# Patient Record
Sex: Female | Born: 1970 | Race: White | Hispanic: No | Marital: Single | Smoking: Current every day smoker
Health system: Southern US, Community
[De-identification: ages and names within clinical notes are randomized; demographics above are authoritative.]

## PROBLEM LIST (undated history)

## (undated) DIAGNOSIS — K2270 Barrett's esophagus without dysplasia: Secondary | ICD-10-CM

## (undated) HISTORY — PX: HERNIA REPAIR: SHX51

---

## 2004-12-08 ENCOUNTER — Emergency Department: Payer: Self-pay | Admitting: Emergency Medicine

## 2005-10-15 ENCOUNTER — Emergency Department: Payer: Self-pay | Admitting: Emergency Medicine

## 2006-02-11 ENCOUNTER — Emergency Department: Payer: Self-pay | Admitting: Internal Medicine

## 2007-09-17 ENCOUNTER — Emergency Department: Payer: Self-pay | Admitting: Emergency Medicine

## 2007-10-09 ENCOUNTER — Emergency Department: Payer: Self-pay | Admitting: Emergency Medicine

## 2007-10-22 ENCOUNTER — Emergency Department: Payer: Self-pay | Admitting: Emergency Medicine

## 2007-11-03 ENCOUNTER — Emergency Department: Payer: Self-pay | Admitting: Emergency Medicine

## 2007-11-13 ENCOUNTER — Other Ambulatory Visit: Payer: Self-pay

## 2007-11-13 ENCOUNTER — Emergency Department: Payer: Self-pay | Admitting: Emergency Medicine

## 2007-12-07 ENCOUNTER — Emergency Department (HOSPITAL_COMMUNITY): Admission: EM | Admit: 2007-12-07 | Discharge: 2007-12-07 | Payer: Self-pay | Admitting: Emergency Medicine

## 2007-12-09 ENCOUNTER — Emergency Department: Payer: Self-pay | Admitting: Emergency Medicine

## 2007-12-16 ENCOUNTER — Emergency Department: Payer: Self-pay | Admitting: Emergency Medicine

## 2007-12-23 ENCOUNTER — Emergency Department: Payer: Self-pay | Admitting: Emergency Medicine

## 2008-01-07 ENCOUNTER — Emergency Department: Payer: Self-pay | Admitting: Emergency Medicine

## 2008-01-19 ENCOUNTER — Emergency Department: Payer: Self-pay | Admitting: Emergency Medicine

## 2008-01-30 ENCOUNTER — Emergency Department: Payer: Self-pay | Admitting: Emergency Medicine

## 2008-06-14 ENCOUNTER — Emergency Department: Payer: Self-pay | Admitting: Emergency Medicine

## 2008-07-18 ENCOUNTER — Emergency Department: Payer: Self-pay | Admitting: Emergency Medicine

## 2008-07-18 ENCOUNTER — Other Ambulatory Visit: Payer: Self-pay

## 2008-10-09 IMAGING — US ABDOMEN ULTRASOUND
1 series · 17 of 25 positions shown · non-contrast
Comparison: none

REASON FOR EXAM: RUQ pain, transaminitis
COMMENTS:

[Series 1: abdomen ultrasound · 17 of 61 slices shown]
[im 1/61]
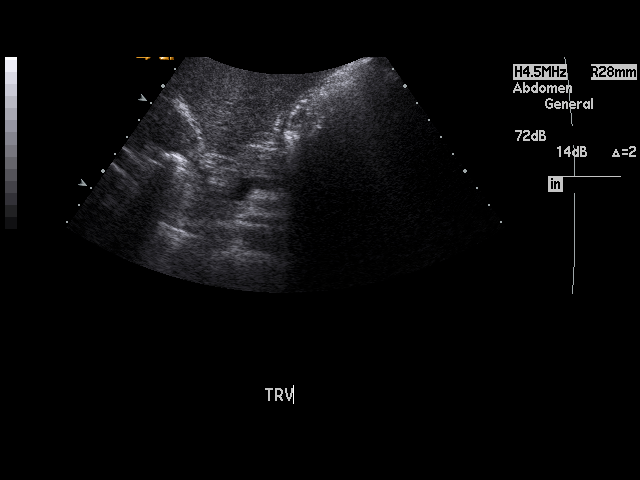
[im 6/61]
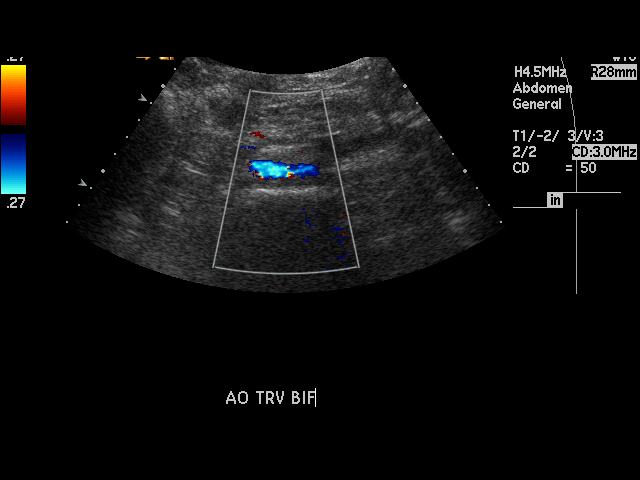
[im 8/61]
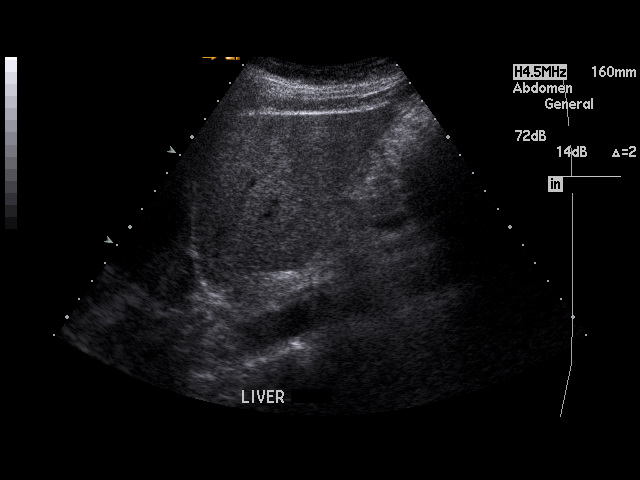
[im 13/61]
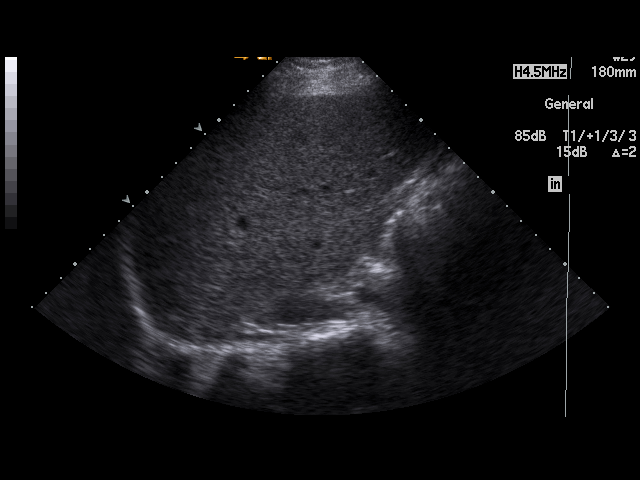
[im 16/61]
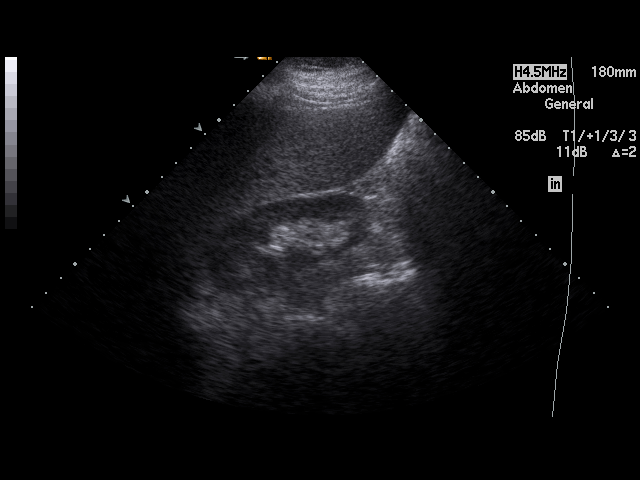
[im 21/61]
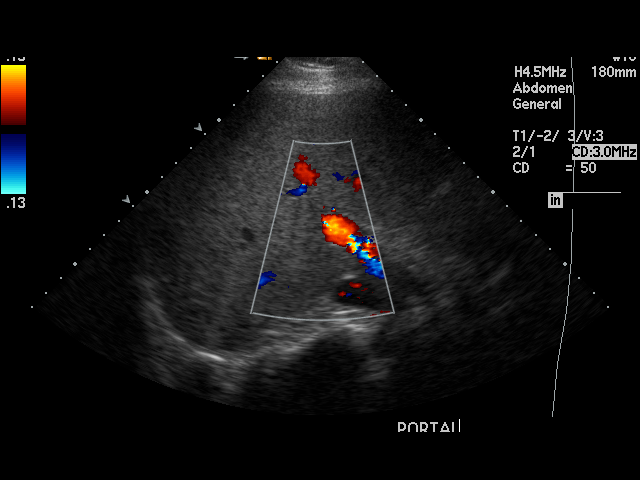
[im 23/61]
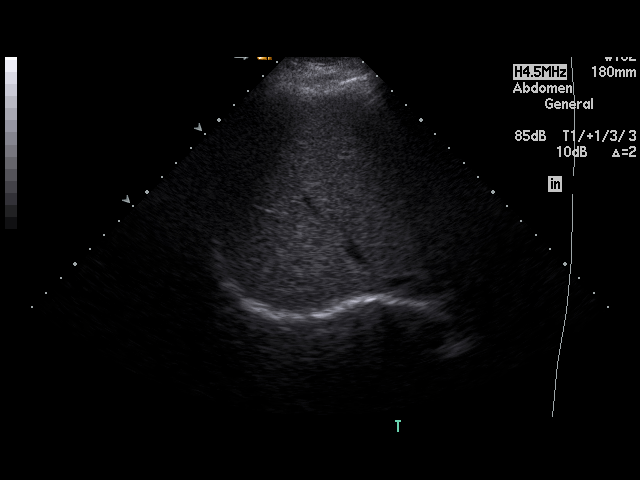
[im 28/61]
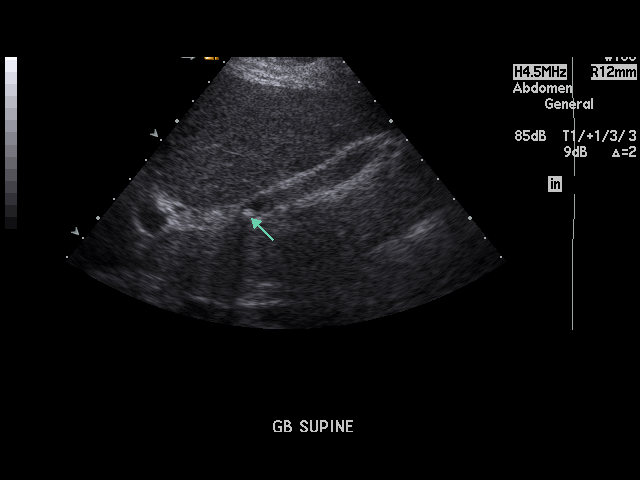
[im 31/61]
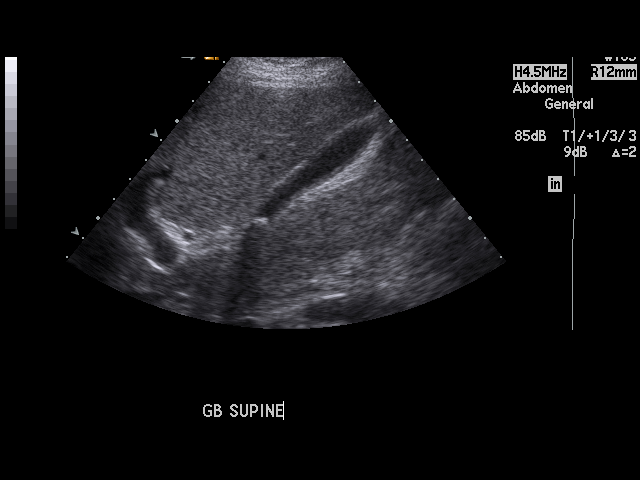
[im 33/61]
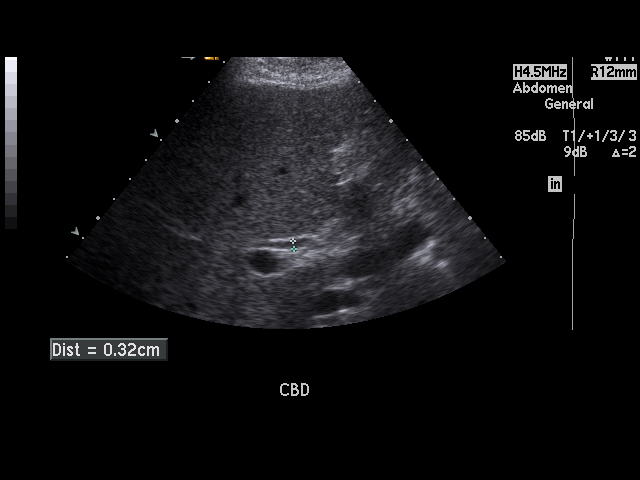
[im 38/61]
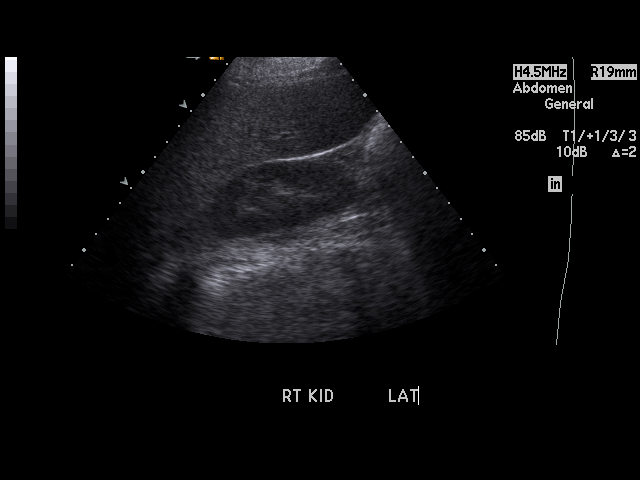
[im 41/61]
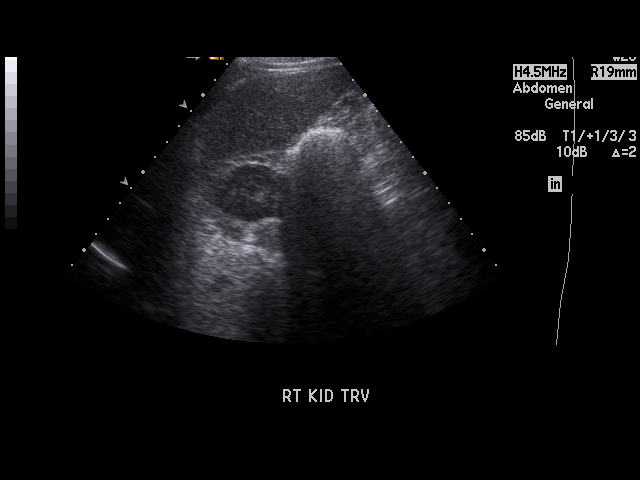
[im 46/61]
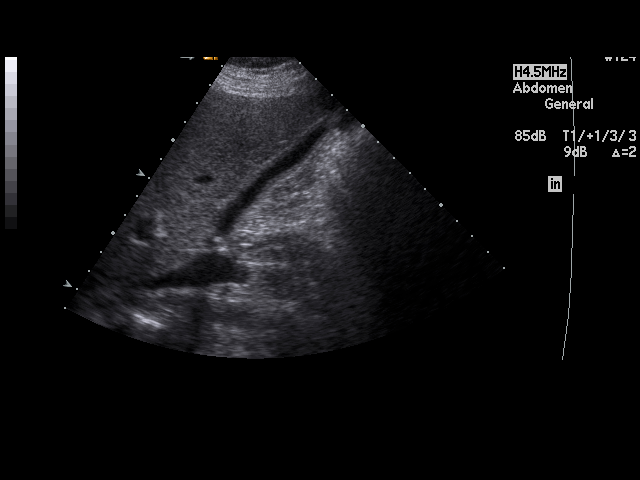
[im 48/61]
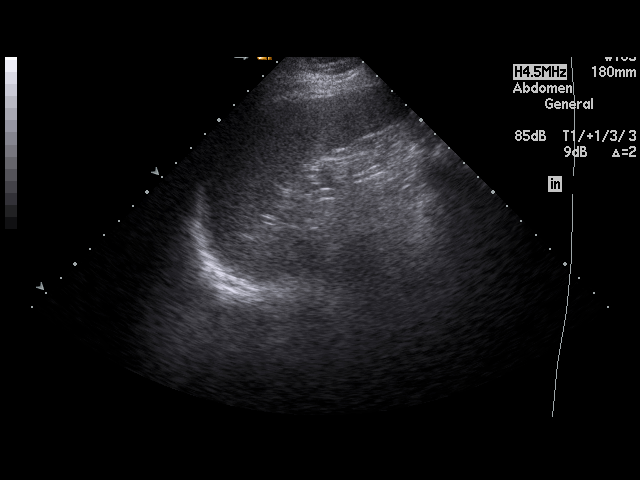
[im 53/61]
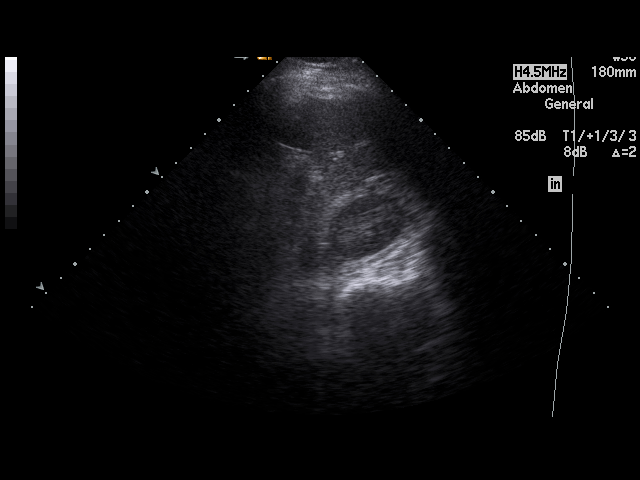
[im 56/61]
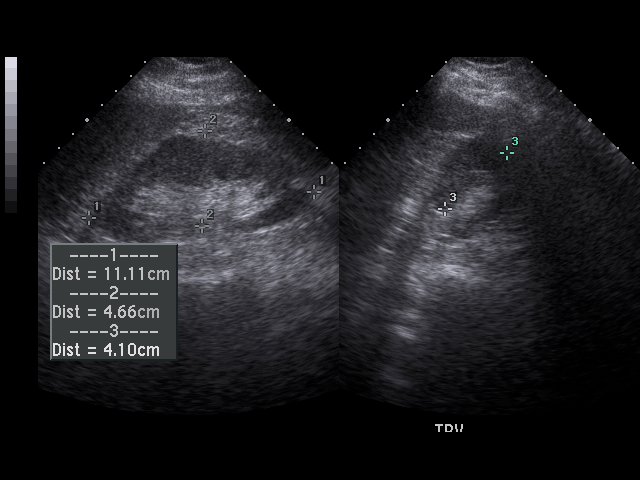
[im 61/61]
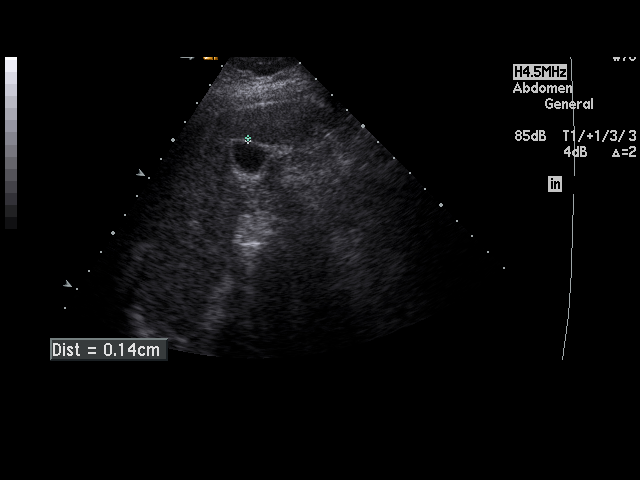

[17 of 25 positions shown; findings below may reference images not displayed]

PROCEDURE:     US  - US ABDOMEN GENERAL SURVEY  - December 16, 2007  [DATE]

RESULT:     The liver exhibits no focal mass or ductal dilation. There may
be an element of fatty infiltration present. Portal venous flow is normal in
direction toward the liver. The pancreas could be partially imaged but bowel
gas obscured portions of it. No gross lesion was identified. The gallbladder
is contracted. There are gallstones near the gallbladder neck which cause
distal shadowing. I do not see a sonographic Murphy's sign and there is no
wall thickening or pericholecystic fluid. The common bile duct is normal at
3.2 mm in diameter. The spleen is normal in size and echotexture. Survey
views of the abdominal aorta are normal. The kidneys are normal in size and
echotexture with no evidence of obstruction. There is no evidence of ascites.
IMPRESSION: 1.  The gallbladder is contracted. There are gallstones present.
2.  I do not see acute abnormality elsewhere within the abdomen.

This report was called to the [HOSPITAL] the conclusion of the
study.

## 2008-11-06 ENCOUNTER — Emergency Department: Payer: Self-pay | Admitting: Emergency Medicine

## 2009-06-10 ENCOUNTER — Emergency Department: Payer: Self-pay | Admitting: Emergency Medicine

## 2009-06-17 ENCOUNTER — Emergency Department: Payer: Self-pay | Admitting: Emergency Medicine

## 2009-06-23 ENCOUNTER — Emergency Department: Payer: Self-pay | Admitting: Emergency Medicine

## 2009-09-12 ENCOUNTER — Emergency Department: Payer: Self-pay | Admitting: Emergency Medicine

## 2010-02-17 ENCOUNTER — Emergency Department: Payer: Self-pay | Admitting: Emergency Medicine

## 2010-02-18 ENCOUNTER — Emergency Department: Payer: Self-pay | Admitting: Emergency Medicine

## 2010-02-28 ENCOUNTER — Emergency Department: Payer: Self-pay | Admitting: Emergency Medicine

## 2010-06-15 ENCOUNTER — Emergency Department: Payer: Self-pay | Admitting: Emergency Medicine

## 2010-07-29 ENCOUNTER — Emergency Department: Payer: Self-pay | Admitting: Emergency Medicine

## 2010-08-09 ENCOUNTER — Emergency Department: Payer: Self-pay | Admitting: Emergency Medicine

## 2010-10-06 ENCOUNTER — Emergency Department: Payer: Self-pay | Admitting: Emergency Medicine

## 2018-12-12 ENCOUNTER — Ambulatory Visit: Payer: Self-pay | Admitting: Family Medicine

## 2018-12-12 NOTE — Progress Notes (Deleted)
Patient: Sheila Caldwell, Female    DOB: 1971/06/07, 47 y.o.   MRN: [id number] Visit Date: 12/12/2018  Today's Provider: Shirlee Latch, MD   No chief complaint on file.  Subjective:    New Patient:  Sheila Caldwell is a 47 y.o. female who presents today to Establish Care.  She feels {DESC; WELL/FAIRLY WELL/POORLY:18703}. She reports exercising ***. She reports she is sleeping {DESC; WELL/FAIRLY WELL/POORLY:18703}.  -----------------------------------------------------------------   Review of Systems  Constitutional: Negative.   HENT: Negative.   Eyes: Negative.   Respiratory: Negative.   Cardiovascular: Negative.   Gastrointestinal: Negative.   Endocrine: Negative.   Genitourinary: Negative.   Musculoskeletal: Negative.   Skin: Negative.   Allergic/Immunologic: Negative.   Neurological: Negative.   Hematological: Negative.   Psychiatric/Behavioral: Negative.     Social History      She         Social History   Socioeconomic History  . Marital status: Single    Spouse name: Not on file  . Number of children: Not on file  . Years of education: Not on file  . Highest education level: Not on file  Occupational History  . Not on file  Social Needs  . Financial resource strain: Not on file  . Food insecurity:    Worry: Not on file    Inability: Not on file  . Transportation needs:    Medical: Not on file    Non-medical: Not on file  Tobacco Use  . Smoking status: Not on file  Substance and Sexual Activity  . Alcohol use: Not on file  . Drug use: Not on file  . Sexual activity: Not on file  Lifestyle  . Physical activity:    Days per week: Not on file    Minutes per session: Not on file  . Stress: Not on file  Relationships  . Social connections:    Talks on phone: Not on file    Gets together: Not on file    Attends religious service: Not on file    Active member of club or organization: Not on file    Attends meetings of clubs or  organizations: Not on file    Relationship status: Not on file  Other Topics Concern  . Not on file  Social History Narrative  . Not on file    No past medical history on file.   There are no active problems to display for this patient.   *** The histories are not reviewed yet. Please review them in the "History" navigator section and refresh this SmartLink.  Family History        No family status information on file.        Her family history is not on file.      Allergies not on file  No current outpatient medications on file.   No care team member to display      Objective:   Vitals: There were no vitals taken for this visit.  There were no vitals filed for this visit.   Physical Exam   Depression Screen No flowsheet data found.    Assessment & Plan:     Routine Health Maintenance and Physical Exam  Exercise Activities and Dietary recommendations Goals   None      There is no immunization history on file for this patient.  There are no preventive care reminders to display for this patient.   Discussed health benefits of physical  activity, and encouraged her to engage in regular exercise appropriate for her age and condition.    --------------------------------------------------------------------    Shirlee LatchAngela Bacigalupo, MD  [facility]

## 2019-08-13 ENCOUNTER — Other Ambulatory Visit: Payer: Self-pay

## 2019-08-13 ENCOUNTER — Emergency Department
Admission: EM | Admit: 2019-08-13 | Discharge: 2019-08-13 | Disposition: A | Discharge status: Home / Self Care | Payer: Medicare Other | Attending: Student in an Organized Health Care Education/Training Program | Admitting: Student in an Organized Health Care Education/Training Program

## 2019-08-13 ENCOUNTER — Encounter: Payer: Self-pay | Admitting: Medical Oncology

## 2019-08-13 DIAGNOSIS — N3090 Cystitis, unspecified without hematuria: Secondary | ICD-10-CM | POA: Diagnosis not present

## 2019-08-13 DIAGNOSIS — R1012 Left upper quadrant pain: Secondary | ICD-10-CM | POA: Insufficient documentation

## 2019-08-13 DIAGNOSIS — N3000 Acute cystitis without hematuria: Secondary | ICD-10-CM

## 2019-08-13 HISTORY — DX: Barrett's esophagus without dysplasia: K22.70

## 2019-08-13 LAB — COMPREHENSIVE METABOLIC PANEL
ALT: 24 U/L (ref 0–44)
AST: 19 U/L (ref 15–41)
Albumin: 4.2 g/dL (ref 3.5–5.0)
Alkaline Phosphatase: 118 U/L (ref 38–126)
Anion gap: 7 (ref 5–15)
BUN: 9 mg/dL (ref 6–20)
CO2: 26 mmol/L (ref 22–32)
Calcium: 9.3 mg/dL (ref 8.9–10.3)
Chloride: 103 mmol/L (ref 98–111)
Creatinine, Ser: 0.65 mg/dL (ref 0.44–1.00)
GFR calc Af Amer: >60 mL/min (ref >60)
GFR calc non Af Amer: >60 mL/min (ref >60)
Glucose, Bld: 98 mg/dL (ref 70–99)
Potassium: 3.2 mmol/L — ABNORMAL LOW (ref 3.5–5.1)
Sodium: 136 mmol/L (ref 135–145)
Total Bilirubin: 0.9 mg/dL (ref 0.3–1.2)
Total Protein: 7.2 g/dL (ref 6.5–8.1)

## 2019-08-13 LAB — URINALYSIS, COMPLETE (UACMP) WITH MICROSCOPIC
Bilirubin Urine: NEGATIVE
Glucose, UA: NEGATIVE mg/dL
Hgb urine dipstick: NEGATIVE
Ketones, ur: NEGATIVE mg/dL
Nitrite: POSITIVE — AB
Protein, ur: 30 mg/dL — AB
Specific Gravity, Urine: 1.026 (ref 1.005–1.030)
pH: 5 (ref 5.0–8.0)

## 2019-08-13 LAB — CBC
HCT: 41.1 % (ref 36.0–46.0)
Hemoglobin: 14.2 g/dL (ref 12.0–15.0)
MCH: 30 pg (ref 26.0–34.0)
MCHC: 34.5 g/dL (ref 30.0–36.0)
MCV: 86.9 fL (ref 80.0–100.0)
Platelets: 317 10*3/uL (ref 150–400)
RBC: 4.73 MIL/uL (ref 3.87–5.11)
RDW: 13.5 % (ref 11.5–15.5)
WBC: 9.3 10*3/uL (ref 4.0–10.5)
nRBC: 0 % (ref 0.0–0.2)

## 2019-08-13 LAB — LIPASE, BLOOD: Lipase: 30 U/L (ref 11–51)

## 2019-08-13 MED ORDER — ONDANSETRON HCL 4 MG PO TABS: 4.0000 mg | ORAL_TABLET | Freq: Every day | ORAL | 0 refills | Status: AC | PRN

## 2019-08-13 MED ORDER — ONDANSETRON 4 MG PO TBDP
4.0000 mg | ORAL_TABLET | Freq: Once | ORAL | Status: AC
  Administered 2019-08-13: 4 mg via ORAL
  Filled 2019-08-13: qty 1

## 2019-08-13 MED ORDER — CEPHALEXIN 500 MG PO CAPS: 500.0000 mg | ORAL_CAPSULE | Freq: Three times a day (TID) | ORAL | 0 refills | Status: AC

## 2019-08-13 MED ORDER — CEPHALEXIN 500 MG PO CAPS
500.0000 mg | ORAL_CAPSULE | Freq: Once | ORAL | Status: AC
  Administered 2019-08-13: 500 mg via ORAL
  Filled 2019-08-13: qty 1

## 2019-08-13 NOTE — ED Triage Notes (Signed)
Pt reports that she began today having left upper abd pain with nausea. Pt reports pain comes and goes and is sharp in nature. Reports multiple issues with hernias.

## 2019-08-13 NOTE — ED Notes (Signed)
Left upper to mid abdominal pain that started today. Upper back pain yesterday. Pt reports extensive abdominal surgeries " and mesh throughout stomach", concerned over problems with it. Decreased appetite beginning today.

## 2019-08-13 NOTE — ED Notes (Signed)
Pt alert and oriented X 4, stable for discharge. RR even and unlabored, color WNL. Discussed discharge instructions and follow up when appropriate. Instructed to follow up with ER for any life threatening symptoms or concerns that patient or family of patient may have  

## 2019-08-13 NOTE — Discharge Instructions (Signed)

## 2019-08-13 NOTE — ED Notes (Signed)
First Nurse Note; Pt brought over via [place] due to LUQ abdominal pain.  NAD noted upon arrival.

## 2019-08-13 NOTE — ED Provider Notes (Signed)
[facility] Emergency Department Provider Note    First MD Initiated Contact with Patient 08/13/19 1606     (approximate)  I have reviewed the triage vital signs and the nursing notes.   HISTORY  Chief Complaint Abdominal Pain    HPI Sheila Caldwell is a 48 y.o. female presents the ER for evaluation of left upper quadrant pain is well as increased urinary frequency and urgency the past several weeks.  Denies any fevers.  Does have some nausea.  States she has history of multiple hernias and is worried that she is got a recurrent 1.  Does not have any flank pain.  No hematuria.  Has had urinary tract infections with similar symptoms in the past.  Is tolerating oral hydration.  Still passing gas.    Past Medical History:  Diagnosis Date   Barrett esophagus    No family history on file. Past Surgical History:  Procedure Laterality Date   HERNIA REPAIR     There are no active problems to display for this patient.     Prior to Admission medications   Medication Sig Start Date End Date Taking? Authorizing Provider  cephALEXin (KEFLEX) 500 MG capsule Take 1 capsule (500 mg total) by mouth 3 (three) times daily for 7 days. 08/13/19 08/20/19  Willy Eddyobinson, Syretta Kochel, MD  ondansetron (ZOFRAN) 4 MG tablet Take 1 tablet (4 mg total) by mouth daily as needed. 08/13/19 08/12/20  Willy Eddyobinson, Raquelle Pietro, MD    Allergies Patient has no known allergies.    Social History Social History   Tobacco Use   Smoking status: Not on file  Substance Use Topics   Alcohol use: Not on file   Drug use: Not on file    Review of Systems Patient denies headaches, rhinorrhea, blurry vision, numbness, shortness of breath, chest pain, edema, cough, abdominal pain, nausea, vomiting, diarrhea, dysuria, fevers, rashes or hallucinations unless otherwise stated above in HPI. ____________________________________________   PHYSICAL EXAM:  VITAL SIGNS: Vitals:   08/13/19 1431  BP:  123/82  Pulse: 98  Resp: 16  Temp: 98.6 F (37 C)  SpO2: 97%    Constitutional: Alert and oriented.  Eyes: Conjunctivae are normal.  Head: Atraumatic. Nose: No congestion/rhinnorhea. Mouth/Throat: Mucous membranes are moist.   Neck: No stridor. Painless ROM.  Cardiovascular: Normal rate, regular rhythm. Grossly normal heart sounds.  Good peripheral circulation. Respiratory: Normal respiratory effort.  No retractions. Lungs CTAB. Gastrointestinal: Soft and nontender in all four quadrants. No distention. No abdominal bruits. No CVA tenderness. Genitourinary:  Musculoskeletal: No lower extremity tenderness nor edema.  No joint effusions. Neurologic:  Normal speech and language. No gross focal neurologic deficits are appreciated. No facial droop Skin:  Skin is warm, dry and intact. No rash noted. Psychiatric: Mood and affect are normal. Speech and behavior are normal.  ____________________________________________   LABS (all labs ordered are listed, but only abnormal results are displayed)  Results for orders placed or performed during the hospital encounter of 08/13/19 (from the past 24 hour(s))  Urinalysis, Complete w Microscopic     Status: Abnormal   Collection Time: 08/13/19  2:34 PM  Result Value Ref Range   Color, Urine AMBER (A) YELLOW   APPearance CLOUDY (A) CLEAR   Specific Gravity, Urine 1.026 1.005 - 1.030   pH 5.0 5.0 - 8.0   Glucose, UA NEGATIVE NEGATIVE mg/dL   Hgb urine dipstick NEGATIVE NEGATIVE   Bilirubin Urine NEGATIVE NEGATIVE   Ketones, ur NEGATIVE NEGATIVE mg/dL  Protein, ur 30 (A) NEGATIVE mg/dL   Nitrite POSITIVE (A) NEGATIVE   Leukocytes,Ua SMALL (A) NEGATIVE   RBC / HPF 0-5 0 - 5 RBC/hpf   WBC, UA 11-20 0 - 5 WBC/hpf   Bacteria, UA MANY (A) NONE SEEN   Squamous Epithelial / LPF 6-10 0 - 5   Mucus PRESENT    Hyaline Casts, UA PRESENT   Lipase, blood     Status: None   Collection Time: 08/13/19  2:41 PM  Result Value Ref Range   Lipase 30 11 -  51 U/L  Comprehensive metabolic panel     Status: Abnormal   Collection Time: 08/13/19  2:41 PM  Result Value Ref Range   Sodium 136 135 - 145 mmol/L   Potassium 3.2 (L) 3.5 - 5.1 mmol/L   Chloride 103 98 - 111 mmol/L   CO2 26 22 - 32 mmol/L   Glucose, Bld 98 70 - 99 mg/dL   BUN 9 6 - 20 mg/dL   Creatinine, Ser 4.090.65 0.44 - 1.00 mg/dL   Calcium 9.3 8.9 - 81.110.3 mg/dL   Total Protein 7.2 6.5 - 8.1 g/dL   Albumin 4.2 3.5 - 5.0 g/dL   AST 19 15 - 41 U/L   ALT 24 0 - 44 U/L   Alkaline Phosphatase 118 38 - 126 U/L   Total Bilirubin 0.9 0.3 - 1.2 mg/dL   GFR calc non Af Amer >60 >60 mL/min   GFR calc Af Amer >60 >60 mL/min   Anion gap 7 5 - 15  CBC     Status: None   Collection Time: 08/13/19  2:41 PM  Result Value Ref Range   WBC 9.3 4.0 - 10.5 K/uL   RBC 4.73 3.87 - 5.11 MIL/uL   Hemoglobin 14.2 12.0 - 15.0 g/dL   HCT 91.441.1 78.236.0 - 95.646.0 %   MCV 86.9 80.0 - 100.0 fL   MCH 30.0 26.0 - 34.0 pg   MCHC 34.5 30.0 - 36.0 g/dL   RDW 21.313.5 08.611.5 - 57.815.5 %   Platelets 317 150 - 400 K/uL   nRBC 0.0 0.0 - 0.2 %   ____________________________________________  EKG ____________________________________________  RADIOLOGY   ____________________________________________   PROCEDURES  Procedure(s) performed:  Procedures    Critical Care performed: no ____________________________________________   INITIAL IMPRESSION / ASSESSMENT AND PLAN / ED COURSE  Pertinent labs & imaging results that were available during my care of the patient were reviewed by me and considered in my medical decision making (see chart for details).   DDX: Cystitis, pyelonephritis, stone, SBO, hernia  Sheila Caldwell is a 48 y.o. who presents to the ED with symptoms as described above.  Patient does have evidence of acute cystitis.  Normal white count and she is afebrile.  Her abdominal exam is soft and benign.  Not consistent with stone.  We discussed option for CT imaging to evaluate for recurrence of hernia the  clinically have lower suspicion for this patient has stated that she will decline any additional imaging of her prefer to try antiemetic, antibiotics and proceed with a trial of outpatient management.  This seems reasonable.  She demonstrates understanding of signs and symptoms for which she should return to the ER.     The patient was evaluated in Emergency Department today for the symptoms described in the history of present illness. He/she was evaluated in the context of the global COVID-19 pandemic, which necessitated consideration that the patient might be at risk for  infection with the SARS-CoV-2 virus that causes COVID-19. Institutional protocols and algorithms that pertain to the evaluation of patients at risk for COVID-19 are in a state of rapid change based on information released by regulatory bodies including the CDC and federal and state organizations. These policies and algorithms were followed during the patient's care in the ED.  As part of my medical decision making, I reviewed the following data within the [place] notes reviewed and incorporated, Labs reviewed, notes from prior ED visits and [place] Controlled Substance Database   ____________________________________________   FINAL CLINICAL IMPRESSION(S) / ED DIAGNOSES  Final diagnoses:  Left upper quadrant pain  Acute cystitis without hematuria      NEW MEDICATIONS STARTED DURING THIS VISIT:  New Prescriptions   CEPHALEXIN (KEFLEX) 500 MG CAPSULE    Take 1 capsule (500 mg total) by mouth 3 (three) times daily for 7 days.   ONDANSETRON (ZOFRAN) 4 MG TABLET    Take 1 tablet (4 mg total) by mouth daily as needed.     Note:  This document was prepared using Dragon voice recognition software and may include unintentional dictation errors.    Merlyn Lot, MD 08/13/19 [phone]

## 2019-12-04 ENCOUNTER — Other Ambulatory Visit: Payer: Self-pay

## 2019-12-04 ENCOUNTER — Emergency Department
Admission: EM | Admit: 2019-12-04 | Discharge: 2019-12-04 | Disposition: A | Discharge status: Home / Self Care | Payer: Medicare Other | Attending: Emergency Medicine | Admitting: Emergency Medicine

## 2019-12-04 ENCOUNTER — Encounter: Payer: Self-pay | Admitting: Emergency Medicine

## 2019-12-04 ENCOUNTER — Emergency Department: Payer: Medicare Other

## 2019-12-04 DIAGNOSIS — R52 Pain, unspecified: Secondary | ICD-10-CM

## 2019-12-04 DIAGNOSIS — M25512 Pain in left shoulder: Secondary | ICD-10-CM | POA: Diagnosis present

## 2019-12-04 MED ORDER — MORPHINE SULFATE (PF) 4 MG/ML IV SOLN
4.0000 mg | Freq: Once | INTRAVENOUS | Status: AC
  Administered 2019-12-04: 12:00:00 4 mg via INTRAMUSCULAR
  Filled 2019-12-04: qty 1

## 2019-12-04 MED ORDER — ONDANSETRON 4 MG PO TBDP
4.0000 mg | ORAL_TABLET | Freq: Once | ORAL | Status: AC
  Administered 2019-12-04: 4 mg via ORAL
  Filled 2019-12-04: qty 1

## 2019-12-04 MED ORDER — TRAMADOL HCL 50 MG PO TABS: 50.0000 mg | ORAL_TABLET | Freq: Four times a day (QID) | ORAL | 0 refills | Status: DC | PRN

## 2019-12-04 MED ORDER — MELOXICAM 15 MG PO TABS: 15.0000 mg | ORAL_TABLET | Freq: Every day | ORAL | 2 refills | Status: AC

## 2019-12-04 NOTE — Discharge Instructions (Addendum)
Follow-up with Dr. Posey Pronto at [facility] clinic medicine.  Your appointment is for tomorrow at 10:30 AM.  If you are unable to keep this appointment please call to reschedule.  Take your pain medication as prescribed.  Wear the shoulder immobilizer until seen by Dr. Posey Pronto.  Address for dr patel, [address], [employer] number [phone]

## 2019-12-04 NOTE — ED Notes (Signed)
C/O left shoulder pain x 2 days.  STates pain worse with movement.  Describes pain as stabbing with movement.

## 2019-12-04 NOTE — ED Provider Notes (Signed)
[facility] Emergency Department Provider Note  ____________________________________________   First MD Initiated Contact with Patient 12/04/19 1101     (approximate)  I have reviewed the triage vital signs and the nursing notes.   HISTORY  Chief Complaint Shoulder Pain    HPI Sheila Caldwell is a 48 y.o. female presents emergency department complaint of left shoulder pain for 2 days.  Patient states pain is worse with movement.  She cannot reach overhead or behind her back.  Some numbness or tingling into the hand.  No specific known injury.  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    Past Medical History:  Diagnosis Date  . Barrett esophagus     There are no active problems to display for this patient.   Past Surgical History:  Procedure Laterality Date  . HERNIA REPAIR      Prior to Admission medications   Medication Sig Start Date End Date Taking? Authorizing Provider  meloxicam (MOBIC) 15 MG tablet Take 1 tablet (15 mg total) by mouth daily. 12/04/19 12/03/20  Keyante Durio, Roselyn Bering, PA-C  ondansetron (ZOFRAN) 4 MG tablet Take 1 tablet (4 mg total) by mouth daily as needed. 08/13/19 08/12/20  Willy Eddy, MD  traMADol (ULTRAM) 50 MG tablet Take 1 tablet (50 mg total) by mouth every 6 (six) hours as needed. 12/04/19   Faythe Ghee, PA-C    Allergies Penicillins and Codeine  No family history on file.  Social History Social History   Tobacco Use  . Smoking status: Not on file  Substance Use Topics  . Alcohol use: Not on file  . Drug use: Not on file    Review of Systems  Constitutional: No fever/chills Eyes: No visual changes. ENT: No sore throat. Respiratory: Denies cough Genitourinary: Negative for dysuria. Musculoskeletal: Negative for back pain.  Positive for left shoulder pain Skin: Negative for rash.    ____________________________________________   PHYSICAL EXAM:   VITAL SIGNS: ED Triage Vitals  Enc Vitals Group     BP 12/04/19 1032 123/72     Pulse Rate 12/04/19 1032 97     Resp 12/04/19 1042 20     Temp 12/04/19 1032 98.3 F (36.8 C)     Temp Source 12/04/19 1032 Oral     SpO2 12/04/19 1032 98 %     Weight 12/04/19 1033 140 lb (63.5 kg)     Height 12/04/19 1033 5\' 4"  (1.626 m)     Head Circumference --      Peak Flow --      Pain Score 12/04/19 1039 8     Pain Loc --      Pain Edu? --      Excl. in GC? --     Constitutional: Alert and oriented. Well appearing and in no acute distress. Eyes: Conjunctivae are normal.  Head: Atraumatic. Nose: No congestion/rhinnorhea. Mouth/Throat: Mucous membranes are moist.   Neck:  supple no lymphadenopathy noted Cardiovascular: Normal rate, regular rhythm.  Respiratory: Normal respiratory effort.  No retractions,  GU: deferred Musculoskeletal: Decreased range of motion of the left shoulder, not noticed along the deltoid, patient cannot lift the arm overhead or do internal rotation, pain is reproduced in all range of motion.  Neurovascular is intact, grips are equal bilaterally  neurologic:  Normal speech and language.  Skin:  Skin is warm, dry and intact. No rash noted. Psychiatric: Mood and affect are normal. Speech and behavior  are normal.  ____________________________________________   LABS (all labs ordered are listed, but only abnormal results are displayed)  Labs Reviewed - No data to display ____________________________________________   ____________________________________________  RADIOLOGY  X-ray of the left shoulder is negative  ____________________________________________   PROCEDURES  Procedure(s) performed: Shoulder immobilizer applied by the tech   Procedures    ____________________________________________   INITIAL IMPRESSION / ASSESSMENT AND PLAN / ED COURSE  Pertinent labs & imaging results that were available during my care of the patient were reviewed by  me and considered in my medical decision making (see chart for details).   Patient is 48 year old female presents emergency department complaint of left shoulder pain.  See HPI  Physical exam shows left shoulder to be tender along the deltoid, decreased range of motion, neurovascular intact  X-ray of the left shoulder is negative  Explained findings to the patient.  She is placed in a shoulder immobilizer.  Given prescription for meloxicam and tramadol.  She was given morphine 4 mg IM and Zofran 4 p.o.  Explained to her this is most likely a rotator cuff tear or muscle tear, she is to follow-up with Dr. Posey Pronto tomorrow at 1030.  He was discharged stable condition.    Sheila Caldwell was evaluated in Emergency Department on 12/04/2019 for the symptoms described in the history of present illness. She was evaluated in the context of the global COVID-19 pandemic, which necessitated consideration that the patient might be at risk for infection with the SARS-CoV-2 virus that causes COVID-19. Institutional protocols and algorithms that pertain to the evaluation of patients at risk for COVID-19 are in a state of rapid change based on information released by regulatory bodies including the CDC and federal and state organizations. These policies and algorithms were followed during the patient's care in the ED.   As part of my medical decision making, I reviewed the following data within the [place] notes reviewed and incorporated, Old chart reviewed, Radiograph reviewed , Notes from prior ED visits and [place] Controlled Substance Database  ____________________________________________   FINAL CLINICAL IMPRESSION(S) / ED DIAGNOSES  Final diagnoses:  Acute pain of left shoulder      NEW MEDICATIONS STARTED DURING THIS VISIT:  New Prescriptions   MELOXICAM (MOBIC) 15 MG TABLET    Take 1 tablet (15 mg total) by mouth daily.   TRAMADOL (ULTRAM) 50 MG TABLET    Take 1 tablet (50  mg total) by mouth every 6 (six) hours as needed.     Note:  This document was prepared using Dragon voice recognition software and may include unintentional dictation errors.    Versie Starks, PA-C 12/04/19 1210    Duffy Bruce, MD 12/06/19 [phone]

## 2019-12-04 NOTE — ED Triage Notes (Signed)
Pt reports pain to her left shoulder for the past 2 days. 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

## 2020-02-20 ENCOUNTER — Encounter: Payer: Self-pay | Admitting: Emergency Medicine

## 2020-02-20 ENCOUNTER — Other Ambulatory Visit: Payer: Self-pay

## 2020-02-20 ENCOUNTER — Emergency Department
Admission: EM | Admit: 2020-02-20 | Discharge: 2020-02-20 | Disposition: A | Discharge status: Home / Self Care | Payer: Medicare Other | Attending: Emergency Medicine | Admitting: Emergency Medicine

## 2020-02-20 ENCOUNTER — Emergency Department: Payer: Medicare Other

## 2020-02-20 DIAGNOSIS — R945 Abnormal results of liver function studies: Secondary | ICD-10-CM | POA: Diagnosis not present

## 2020-02-20 DIAGNOSIS — K2270 Barrett's esophagus without dysplasia: Secondary | ICD-10-CM | POA: Diagnosis not present

## 2020-02-20 DIAGNOSIS — R079 Chest pain, unspecified: Secondary | ICD-10-CM | POA: Diagnosis present

## 2020-02-20 DIAGNOSIS — N3090 Cystitis, unspecified without hematuria: Secondary | ICD-10-CM

## 2020-02-20 DIAGNOSIS — R7989 Other specified abnormal findings of blood chemistry: Secondary | ICD-10-CM

## 2020-02-20 DIAGNOSIS — F17200 Nicotine dependence, unspecified, uncomplicated: Secondary | ICD-10-CM | POA: Insufficient documentation

## 2020-02-20 LAB — BASIC METABOLIC PANEL
Anion gap: 8 (ref 5–15)
BUN: 7 mg/dL (ref 6–20)
CO2: 29 mmol/L (ref 22–32)
Calcium: 9.6 mg/dL (ref 8.9–10.3)
Chloride: 100 mmol/L (ref 98–111)
Creatinine, Ser: 0.72 mg/dL (ref 0.44–1.00)
GFR calc Af Amer: >60 mL/min (ref >60)
GFR calc non Af Amer: >60 mL/min (ref >60)
Glucose, Bld: 99 mg/dL (ref 70–99)
Potassium: 4.5 mmol/L (ref 3.5–5.1)
Sodium: 137 mmol/L (ref 135–145)

## 2020-02-20 LAB — URINALYSIS, ROUTINE W REFLEX MICROSCOPIC
Bilirubin Urine: NEGATIVE
Glucose, UA: NEGATIVE mg/dL
Hgb urine dipstick: NEGATIVE
Ketones, ur: NEGATIVE mg/dL
Leukocytes,Ua: NEGATIVE
Nitrite: POSITIVE — AB
Protein, ur: NEGATIVE mg/dL
Specific Gravity, Urine: 1.01 (ref 1.005–1.030)
pH: 5 (ref 5.0–8.0)

## 2020-02-20 LAB — HEPATIC FUNCTION PANEL
ALT: 91 U/L — ABNORMAL HIGH (ref 0–44)
AST: 86 U/L — ABNORMAL HIGH (ref 15–41)
Albumin: 4.2 g/dL (ref 3.5–5.0)
Alkaline Phosphatase: 156 U/L — ABNORMAL HIGH (ref 38–126)
Bilirubin, Direct: 0.2 mg/dL (ref 0.0–0.2)
Indirect Bilirubin: 1 mg/dL — ABNORMAL HIGH (ref 0.3–0.9)
Total Bilirubin: 1.2 mg/dL (ref 0.3–1.2)
Total Protein: 7.4 g/dL (ref 6.5–8.1)

## 2020-02-20 LAB — LIPASE, BLOOD: Lipase: 26 U/L (ref 11–51)

## 2020-02-20 LAB — CBC
HCT: 46.8 % — ABNORMAL HIGH (ref 36.0–46.0)
Hemoglobin: 15.5 g/dL — ABNORMAL HIGH (ref 12.0–15.0)
MCH: 30.2 pg (ref 26.0–34.0)
MCHC: 33.1 g/dL (ref 30.0–36.0)
MCV: 91.1 fL (ref 80.0–100.0)
Platelets: 341 10*3/uL (ref 150–400)
RBC: 5.14 MIL/uL — ABNORMAL HIGH (ref 3.87–5.11)
RDW: 13.2 % (ref 11.5–15.5)
WBC: 9 10*3/uL (ref 4.0–10.5)
nRBC: 0 % (ref 0.0–0.2)

## 2020-02-20 LAB — TROPONIN I (HIGH SENSITIVITY)
Troponin I (High Sensitivity): 2 ng/L (ref <18)
Troponin I (High Sensitivity): 3 ng/L (ref <18)

## 2020-02-20 MED ORDER — SODIUM CHLORIDE 0.9% FLUSH
3.0000 mL | Freq: Once | INTRAVENOUS | Status: AC
  Administered 2020-02-20: 3 mL via INTRAVENOUS

## 2020-02-20 MED ORDER — SULFAMETHOXAZOLE-TRIMETHOPRIM 800-160 MG PO TABS: 1.0000 | ORAL_TABLET | Freq: Two times a day (BID) | ORAL | 0 refills | Status: AC

## 2020-02-20 MED ORDER — PANTOPRAZOLE SODIUM 40 MG PO TBEC: 40.0000 mg | DELAYED_RELEASE_TABLET | Freq: Two times a day (BID) | ORAL | 0 refills | Status: AC

## 2020-02-20 MED ORDER — SUCRALFATE 1 G PO TABS: 1.0000 g | ORAL_TABLET | Freq: Three times a day (TID) | ORAL | 0 refills | Status: AC

## 2020-02-20 NOTE — ED Triage Notes (Signed)
Presents with chest pain which started last pm  States pain eased off but pain returned this am  Describes as "indigestion"

## 2020-02-20 NOTE — ED Provider Notes (Signed)
[facility] Emergency Department Provider Note  ____________________________________________   First MD Initiated Contact with Patient 02/20/20 1454     (approximate)  I have reviewed the triage vital signs and the nursing notes.   HISTORY  Chief Complaint Chest Pain    HPI Sheila Caldwell is a 49 y.o. female with Barrett's esophagus, anxiety who comes in with chest pain.  Patient states that yesterday she started having some indigestion feeling around 6 PM.  The pain continued and got worse and was a sharp pain on the left side of her chest.  She went to bed and the next day she started having the indigestion feeling again with the sharp pain.  The pain was nonradiating, constant, nothing made it better, nothing made it worse.  Patient states that she has a history of Barrett's esophagus but is not been compliant with her PPI.  States that she is supposed to be on 40 mg twice daily.  She states that she just moved from [facility] and she does not have a doctor to prescribe it.  She states that she has had a lot of anxiety recently and contributes some of her symptoms to that.  She denies any shortness of breath contrary to the triage note.  No other risk factors for PE.  Denies any symptoms of coronavirus.  Denies any lower abdominal pain.  Still tolerating p.o.  Still having normal bowel movements.  States that she is having a little bit of increased urinary frequency and has had recurrent UTIs so is worried she might have a UTI.          Past Medical History:  Diagnosis Date  . Barrett esophagus     There are no problems to display for this patient.   Past Surgical History:  Procedure Laterality Date  . HERNIA REPAIR      Prior to Admission medications   Medication Sig Start Date End Date Taking? Authorizing Provider  meloxicam (MOBIC) 15 MG tablet Take 1 tablet (15 mg total) by mouth daily. 12/04/19 12/03/20  Fisher, Linden Dolin, PA-C  ondansetron  (ZOFRAN) 4 MG tablet Take 1 tablet (4 mg total) by mouth daily as needed. 08/13/19 08/12/20  Merlyn Lot, MD  traMADol (ULTRAM) 50 MG tablet Take 1 tablet (50 mg total) by mouth every 6 (six) hours as needed. 12/04/19   Versie Starks, PA-C    Allergies Penicillins and Codeine  No family history on file.  Social History Social History   Tobacco Use  . Smoking status: Current Every Day Smoker  . Smokeless tobacco: Never Used  Substance Use Topics  . Alcohol use: Not on file  . Drug use: Not on file      Review of Systems Constitutional: No fever/chills Eyes: No visual changes. ENT: No sore throat. Cardiovascular: Positive chest pain Respiratory: Denies shortness of breath. Gastrointestinal: No abdominal pain.  No nausea, no vomiting.  No diarrhea.  No constipation. Genitourinary: Negative for dysuria.  Increased frequency Musculoskeletal: Negative for back pain. Skin: Negative for rash. Neurological: Negative for headaches, focal weakness or numbness. All other ROS negative ____________________________________________   PHYSICAL EXAM:  VITAL SIGNS: ED Triage Vitals  Enc Vitals Group     BP 02/20/20 1225 (!) 128/99     Pulse Rate 02/20/20 1225 (!) 108     Resp 02/20/20 1225 18     Temp 02/20/20 1225 97.9 F (36.6 C)     Temp Source 02/20/20 1225 Oral  SpO2 02/20/20 1225 99 %     Weight 02/20/20 1225 148 lb (67.1 kg)     Height 02/20/20 1225 5\' 4"  (1.626 m)     Head Circumference --      Peak Flow --      Pain Score 02/20/20 1222 8     Pain Loc --      Pain Edu? --      Excl. in GC? --     Constitutional: Alert and oriented. Well appearing and in no acute distress. Eyes: Conjunctivae are normal. EOMI. Head: Atraumatic. Nose: No congestion/rhinnorhea. Mouth/Throat: Mucous membranes are moist.   Neck: No stridor. Trachea Midline. FROM Cardiovascular: Normal rate, regular rhythm. Grossly normal heart sounds.  Good peripheral circulation. Respiratory:  Normal respiratory effort.  No retractions. Lungs CTAB. Gastrointestinal: Soft and nontender. No distention. No abdominal bruits.  Musculoskeletal: No lower extremity tenderness nor edema.  No joint effusions. Neurologic:  Normal speech and language. No gross focal neurologic deficits are appreciated.  Skin:  Skin is warm, dry and intact. No rash noted. Psychiatric: Mood and affect are normal. Speech and behavior are normal. GU: Deferred   ____________________________________________   LABS (all labs ordered are listed, but only abnormal results are displayed)  Labs Reviewed  CBC - Abnormal; Notable for the following components:      Result Value   RBC 5.14 (*)    Hemoglobin 15.5 (*)    HCT 46.8 (*)    All other components within normal limits  BASIC METABOLIC PANEL  HEPATIC FUNCTION PANEL  LIPASE, BLOOD  URINALYSIS, ROUTINE W REFLEX MICROSCOPIC  POC URINE PREG, ED  TROPONIN I (HIGH SENSITIVITY)  TROPONIN I (HIGH SENSITIVITY)   ____________________________________________   ED ECG REPORT I, 02/22/20, the attending physician, personally viewed and interpreted this ECG.  Normal sinus rate of 100, no st elevation, no twi, normal intervals. ____________________________________________  RADIOLOGY Concha Se, personally viewed and evaluated these images (plain radiographs) as part of my medical decision making, as well as reviewing the written report by the radiologist.  ED MD interpretation:  No PNA noted.   Official radiology report(s): DG Chest 2 View  Result Date: 02/20/2020 CLINICAL DATA:  Chest pain EXAM: CHEST - 2 VIEW COMPARISON:  March 17, 2017 chest radiograph and March 21, 2017 chest CT FINDINGS: Lungs are clear. Heart size and pulmonary vascularity are normal. No adenopathy. No pneumothorax. No bone lesions. IMPRESSION: Lungs clear.  Cardiac silhouette within normal limits. Electronically Signed   By: March 23, 2017 III M.D.   On: 02/20/2020 13:09     ____________________________________________   PROCEDURES  Procedure(s) performed (including Critical Care):  Procedures   ____________________________________________   INITIAL IMPRESSION / ASSESSMENT AND PLAN / ED COURSE   Sheila Caldwell was evaluated in Emergency Department on 02/20/2020 for the symptoms described in the history of present illness. She was evaluated in the context of the global COVID-19 pandemic, which necessitated consideration that the patient might be at risk for infection with the SARS-CoV-2 virus that causes COVID-19. Institutional protocols and algorithms that pertain to the evaluation of patients at risk for COVID-19 are in a state of rapid change based on information released by regulatory bodies including the CDC and federal and state organizations. These policies and algorithms were followed during the patient's care in the ED.    Most Likely DDx:  -MSK (atypical chest pain) versus esophagitis but will get cardiac markers to evaluate for ACS given risk factors/age -Considered  PE given patient was initially tachycardic but patient reports that she was feeling very anxious initially and that she is not having any shortness of breath at all.  The pain is not pleuritic in nature.  I have low suspicion for PE at this time  -Upon my assessment patient is declining any pain medication or GI cocktail.  States that her pain is very minimal.  Patient is already had her gallbladder removed but will get LFTs to evaluate for retained stone and lipase evaluate for pancreatitis.  DDx that was also considered d/t potential to cause harm, but was found less likely based on history and physical (as detailed above): -PNA (no fevers, cough but CXR to evaluate) -PNX (reassured with equal b/l breath sounds, CXR to evaluate) -Symptomatic anemia (will get H&H) -Aortic Dissection as no tearing pain and no radiation to the mid back, pulses equal -Pericarditis no rub on exam,  EKG changes or hx to suggest dx -Tamponade (no notable SOB, tachycardic, hypotensive) -Esophageal rupture (no h/o diffuse vomitting/no crepitus)   No anemia. Trop negative x2   Liver enzymes are slightly elevated.  Patient states that she has been told this previously.  Patient has had her gallbladder removed and has no right upper quadrant tenderness.  Low suspicion for retained stone at this time given no tenderness.  Patient had this followed up with GI.  Patient's urine looks consistent with UTI.  Patient states that Bactrim and Cipro have worked best for her in the past.  Patient states that she is had a hysterectomy so there is no chance that she could be pregnant  Patient feels comfortable with the above plan and understands return precautions  I discussed the provisional nature of ED diagnosis, the treatment so far, the ongoing plan of care, follow up appointments and return precautions with the patient and any family or support people present. They expressed understanding and agreed with the plan, discharged home.   ____________________________________________   FINAL CLINICAL IMPRESSION(S) / ED DIAGNOSES   Final diagnoses:  Chest pain, unspecified type  Cystitis  Elevated LFTs     MEDICATIONS GIVEN DURING THIS VISIT:  Medications  sodium chloride flush (NS) 0.9 % injection 3 mL (3 mLs Intravenous Given 02/20/20 1421)     ED Discharge Orders         Ordered    pantoprazole (PROTONIX) 40 MG tablet  2 times daily     02/20/20 1516    sucralfate (CARAFATE) 1 g tablet  3 times daily with meals & bedtime     02/20/20 1516    sulfamethoxazole-trimethoprim (BACTRIM DS) 800-160 MG tablet  2 times daily     02/20/20 1554           Note:  This document was prepared using Dragon voice recognition software and may include unintentional dictation errors.   Concha Se, MD 02/20/20 [phone]

## 2020-02-20 NOTE — Discharge Instructions (Addendum)
Your heart attack enzymes were negative.  No signs of heart attack.  This could be related to your esophagus.  Restart your PPI and use the Carafate to help 1 your stomach.  You follow-up with the GI doctor.  Your liver enzymes were slightly elevated in addition also be followed up with a GI doctor.  Your urine was concerning for UTI so we started you on some antibiotics return to the ER for any other concerns

## 2020-02-22 LAB — URINE CULTURE: Culture: >=100000 — AB

## 2020-04-23 ENCOUNTER — Ambulatory Visit: Payer: Medicare Other | Admitting: Gastroenterology

## 2020-04-23 ENCOUNTER — Other Ambulatory Visit: Payer: Self-pay

## 2020-05-25 ENCOUNTER — Emergency Department: Payer: Medicare Other

## 2020-05-25 ENCOUNTER — Emergency Department
Admission: EM | Admit: 2020-05-25 | Discharge: 2020-05-25 | Disposition: A | Discharge status: Home / Self Care | Payer: Medicare Other | Attending: Emergency Medicine | Admitting: Emergency Medicine

## 2020-05-25 DIAGNOSIS — F17210 Nicotine dependence, cigarettes, uncomplicated: Secondary | ICD-10-CM | POA: Diagnosis not present

## 2020-05-25 DIAGNOSIS — M79604 Pain in right leg: Secondary | ICD-10-CM | POA: Diagnosis not present

## 2020-05-25 DIAGNOSIS — R109 Unspecified abdominal pain: Secondary | ICD-10-CM | POA: Diagnosis not present

## 2020-05-25 DIAGNOSIS — Z79899 Other long term (current) drug therapy: Secondary | ICD-10-CM | POA: Diagnosis not present

## 2020-05-25 LAB — COMPREHENSIVE METABOLIC PANEL
ALT: 14 U/L (ref 0–44)
AST: 20 U/L (ref 15–41)
Albumin: 3.6 g/dL (ref 3.5–5.0)
Alkaline Phosphatase: 98 U/L (ref 38–126)
Anion gap: 6 (ref 5–15)
BUN: 8 mg/dL (ref 6–20)
CO2: 30 mmol/L (ref 22–32)
Calcium: 8.8 mg/dL — ABNORMAL LOW (ref 8.9–10.3)
Chloride: 104 mmol/L (ref 98–111)
Creatinine, Ser: 0.74 mg/dL (ref 0.44–1.00)
GFR calc Af Amer: >60 mL/min (ref >60)
GFR calc non Af Amer: >60 mL/min (ref >60)
Glucose, Bld: 121 mg/dL — ABNORMAL HIGH (ref 70–99)
Potassium: 3.7 mmol/L (ref 3.5–5.1)
Sodium: 140 mmol/L (ref 135–145)
Total Bilirubin: 0.7 mg/dL (ref 0.3–1.2)
Total Protein: 6.4 g/dL — ABNORMAL LOW (ref 6.5–8.1)

## 2020-05-25 LAB — URINALYSIS, COMPLETE (UACMP) WITH MICROSCOPIC
Bilirubin Urine: NEGATIVE
Glucose, UA: NEGATIVE mg/dL
Hgb urine dipstick: NEGATIVE
Ketones, ur: NEGATIVE mg/dL
Nitrite: NEGATIVE
Protein, ur: NEGATIVE mg/dL
Specific Gravity, Urine: 1.01 (ref 1.005–1.030)
pH: 7 (ref 5.0–8.0)

## 2020-05-25 LAB — CBC
HCT: 41.6 % (ref 36.0–46.0)
Hemoglobin: 13.8 g/dL (ref 12.0–15.0)
MCH: 30.2 pg (ref 26.0–34.0)
MCHC: 33.2 g/dL (ref 30.0–36.0)
MCV: 91 fL (ref 80.0–100.0)
Platelets: 326 10*3/uL (ref 150–400)
RBC: 4.57 MIL/uL (ref 3.87–5.11)
RDW: 13.5 % (ref 11.5–15.5)
WBC: 8.1 10*3/uL (ref 4.0–10.5)
nRBC: 0 % (ref 0.0–0.2)

## 2020-05-25 MED ORDER — TRAMADOL HCL 50 MG PO TABS
100.0000 mg | ORAL_TABLET | Freq: Once | ORAL | Status: AC
  Administered 2020-05-25: 100 mg via ORAL
  Filled 2020-05-25: qty 2

## 2020-05-25 MED ORDER — IOHEXOL 9 MG/ML PO SOLN
500.0000 mL | Freq: Two times a day (BID) | ORAL | Status: DC | PRN
  Administered 2020-05-25: 500 mL via ORAL

## 2020-05-25 MED ORDER — IOHEXOL 300 MG/ML  SOLN
100.0000 mL | Freq: Once | INTRAMUSCULAR | Status: AC | PRN
  Administered 2020-05-25: 100 mL via INTRAVENOUS

## 2020-05-25 MED ORDER — TRAMADOL HCL 50 MG PO TABS: 50.0000 mg | ORAL_TABLET | Freq: Four times a day (QID) | ORAL | 0 refills | Status: AC | PRN

## 2020-05-25 NOTE — ED Provider Notes (Signed)
[facility] Emergency Department Provider Note  Time seen: 7:42 AM  I have reviewed the triage vital signs and the nursing notes.   HISTORY  Chief Complaint Leg Pain (Tingling. See notes)   HPI Sheila Caldwell is a 49 y.o. female with no significant past medical history presents to the emergency department for right leg discomfort.  According to the patient since 2 PM yesterday she has been experiencing a "throbbing" in the right lower extremity.  Patient states it feels almost tingling at times.  Denies any fever chest pain or shortness of breath.  Patient states it feels similar to how her left leg felt years ago when she had an inguinal hernia on the left side.  Denies any abdominal pain no constipation or incontinence issue denies any weakness in the leg.  Patient states just a moderate "ache."   Past Medical History:  Diagnosis Date  . Barrett esophagus     There are no problems to display for this patient.   Past Surgical History:  Procedure Laterality Date  . HERNIA REPAIR      Prior to Admission medications   Medication Sig Start Date End Date Taking? Authorizing Provider  albuterol (PROVENTIL) (5 MG/ML) 0.5% nebulizer solution Inhale into the lungs. 08/18/17   [provider]  amphetamine-dextroamphetamine (ADDERALL) 30 MG tablet Take 1 tablet by mouth 2 (two) times daily. 04/08/20   [provider]  busPIRone (BUSPAR) 7.5 MG tablet Take 7.5 mg by mouth 3 (three) times daily as needed. 02/18/20   [provider]  CHANTIX CONTINUING MONTH PAK 1 MG tablet Take 1 mg by mouth 2 (two) times daily. 03/03/20   [provider]  clonazePAM [place]) 1 MG tablet Take by mouth. 12/07/17   [provider]  dicyclomine (BENTYL) 20 MG tablet  01/02/16   [provider]  meloxicam (MOBIC) 15 MG tablet Take 1 tablet (15 mg total) by mouth daily. 12/04/19 12/03/20  Fisher, Linden Dolin, PA-C  OLANZapine (ZYPREXA) 7.5 MG  tablet Take 7.5 mg by mouth at bedtime. 02/18/20   [provider]  ondansetron (ZOFRAN) 4 MG tablet Take 1 tablet (4 mg total) by mouth daily as needed. 08/13/19 08/12/20  Merlyn Lot, MD  pantoprazole (PROTONIX) 40 MG tablet Take 1 tablet (40 mg total) by mouth 2 (two) times daily. 02/20/20 03/21/20  Vanessa [place], MD  pravastatin (PRAVACHOL) 40 MG tablet TAKE 1 TABLET (40 MG TOTAL) BY MOUTH DAILY. 02/26/19   [provider]  rizatriptan (MAXALT-MLT) 10 MG disintegrating tablet Take by mouth. 10/02/18   [provider]  sucralfate (CARAFATE) 1 g tablet Take 1 tablet (1 g total) by mouth 4 (four) times daily -  with meals and at bedtime for 20 days. 02/20/20 03/11/20  Vanessa [place], MD  traMADol (ULTRAM) 50 MG tablet Take 1 tablet (50 mg total) by mouth every 6 (six) hours as needed. 12/04/19   Versie Starks, PA-C    Allergies  Allergen Reactions  . Bee Venom Anaphylaxis and Swelling  . Coconut Flavor Swelling    Facial and airway.   . Gabapentin Nausea And Vomiting    Other reaction(s): VOMITING   . Ibuprofen Nausea And Vomiting  . Naproxen Swelling    Other reaction(s): SWELLING/EDEMA Airway swelling   . Pregabalin Other (See Comments)    hallucinations  . Cortisone Rash  . Penicillins Hives  . Prednisone Swelling  . Codeine Rash  . Sulfamethoxazole Rash    Smelt  and tasted burning plastic    No family history on file.  Social History Social History   Tobacco Use  . Smoking status: Current Every Day Smoker  . Smokeless tobacco: Never Used  Substance Use Topics  . Alcohol use: Not Currently  . Drug use: Yes    Types: Marijuana    Review of Systems Constitutional: Negative for fever. Cardiovascular: Negative for chest pain. Respiratory: Negative for shortness of breath. Gastrointestinal: Negative for abdominal pain Musculoskeletal: Right leg discomfort. Neurological: Negative for headache All other ROS  negative  ____________________________________________   PHYSICAL EXAM:  VITAL SIGNS: ED Triage Vitals  Enc Vitals Group     BP 05/25/20 0339 112/76     Pulse Rate 05/25/20 0339 85     Resp 05/25/20 0339 18     Temp 05/25/20 0339 98.4 F (36.9 C)     Temp Source 05/25/20 0339 Oral     SpO2 05/25/20 0339 99 %     Weight 05/25/20 0340 135 lb (61.2 kg)     Height 05/25/20 0340 5\' 4"  (1.626 m)     Head Circumference --      Peak Flow --      Pain Score 05/25/20 0340 9     Pain Loc --      Pain Edu? --      Excl. in GC? --     Constitutional: Alert and oriented. Well appearing and in no distress. Eyes: Normal exam ENT      Head: Normocephalic and atraumatic.      Mouth/Throat: Mucous membranes are moist. Cardiovascular: Normal rate, regular rhythm.  Respiratory: Normal respiratory effort without tachypnea nor retractions. Breath sounds are clear  Gastrointestinal: Soft and nontender. No distention.  Musculoskeletal: Nontender with normal range of motion in all extremities. No lower extremity tenderness or edema.  2+ DP pulse with intact and normal sensation. Neurologic:  Normal speech and language. No gross focal neurologic deficits Skin:  Skin is warm, dry and intact.  Psychiatric: Mood and affect are normal.   ____________________________________________    RADIOLOGY  CT abdomen/pelvis is negative.  ____________________________________________   INITIAL IMPRESSION / ASSESSMENT AND PLAN / ED COURSE  Pertinent labs & imaging results that were available during my care of the patient were reviewed by me and considered in my medical decision making (see chart for details).   Patient presents to the emergency department for right leg discomfort which she describes as an aching or throbbing since 2 PM yesterday.  Currently the patient appears well.  The leg appears normal nontender to palpation with intact sensation.  5/5 motor.  No edema.  Patient has benign abdominal  exam.  Lab work and CT scan ordered from triage are reassuring.  Given the patient's description of throbbing type discomfort in the leg we will obtain an ultrasound to rule out DVT.  If negative we will likely discharge with a short course of pain medication such as Ultram which the patient states she has had in the past.  Ultrasound is negative for DVT.  We will discharge with a short course of pain medication for musculoskeletal discomfort.  Patient follow-up with her doctor.  Discussed return precautions.  Sheila Caldwell was evaluated in Emergency Department on 05/25/2020 for the symptoms described in the history of present illness. She was evaluated in the context of the global COVID-19 pandemic, which necessitated consideration that the patient might be at risk for infection with the SARS-CoV-2 virus that causes COVID-19. Institutional protocols  and algorithms that pertain to the evaluation of patients at risk for COVID-19 are in a state of rapid change based on information released by regulatory bodies including the CDC and federal and state organizations. These policies and algorithms were followed during the patient's care in the ED.  ____________________________________________   FINAL CLINICAL IMPRESSION(S) / ED DIAGNOSES  Right leg pain   Minna Antis, MD 05/25/20 [phone]

## 2020-05-25 NOTE — ED Triage Notes (Signed)
Patient to ED due to right leg pain/tingling due to a "hernia pushing on a nerve." "Its in my inguinal area like last time". Patient continues to rub her leg stating that it makes it feel better. States the aching in her leg is preventing her from sleeping.

## 2020-05-25 NOTE — ED Notes (Addendum)
Awaiting u/s r/o dvt

## 2020-05-25 NOTE — ED Notes (Signed)
Reports  Pain to right groin area that radiates down the leg and behind knee. Thinks she has a DVT. Ms to eval. Ct completed. Labs sent from triage.

## 2020-05-25 NOTE — ED Notes (Signed)
Bedside u/s completed. Po meds given

## 2021-03-19 IMAGING — US US EXTREM LOW VENOUS*R*
1 series · 13 of 24 positions shown · non-contrast
Comparison: None.

CLINICAL DATA: Right leg pain and tingling.



[Series 1: us venous img lower uni right (dvt) · portal-venous · 13 of 26 slices shown]
[im 1/26]
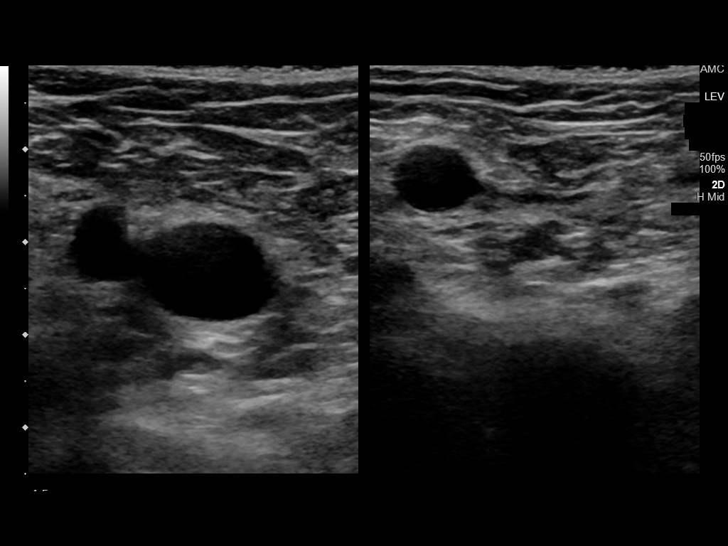
[im 3/26]
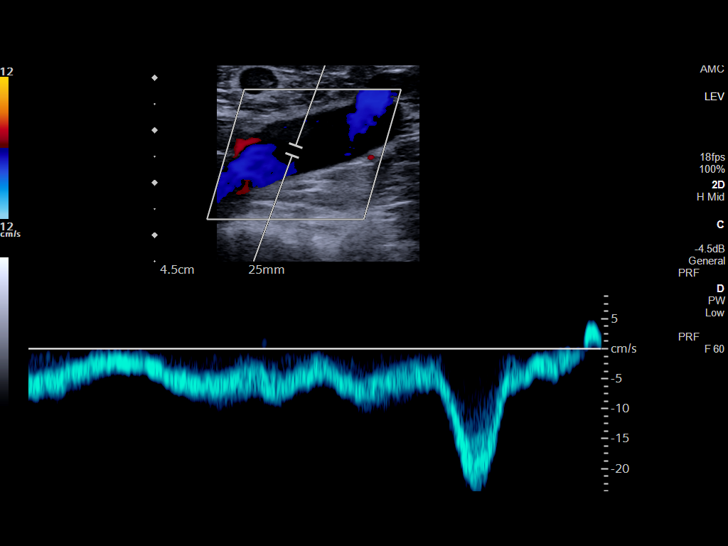
[im 5/26]
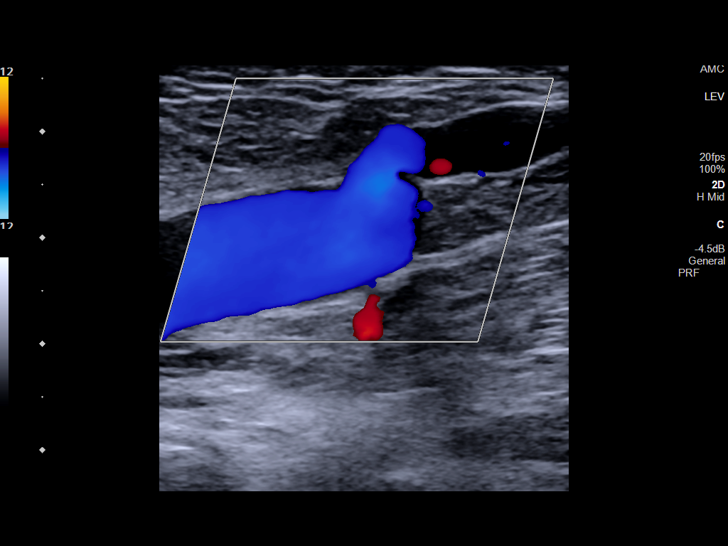
[im 7/26]
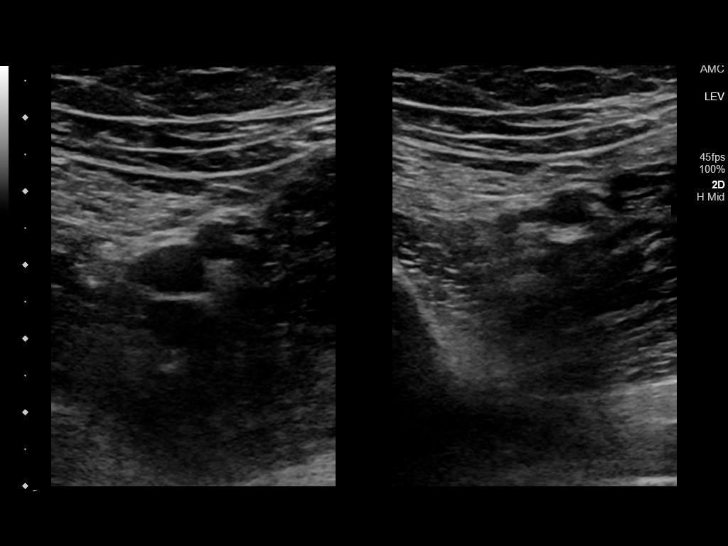
[im 9/26]
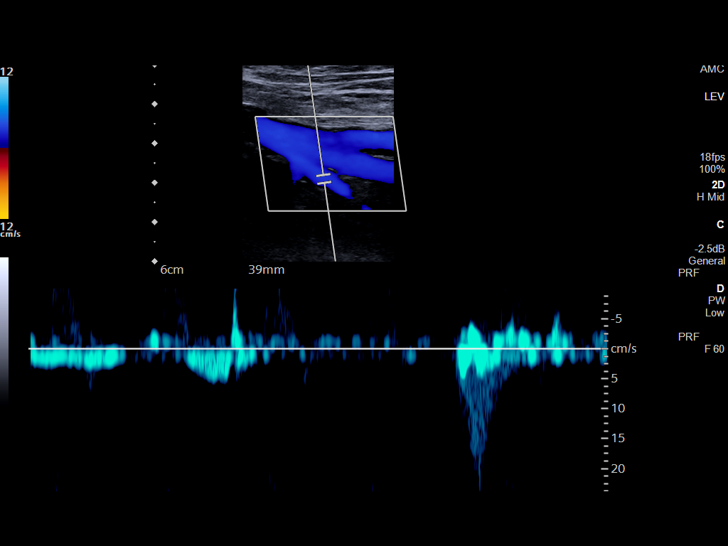
[im 11/26]
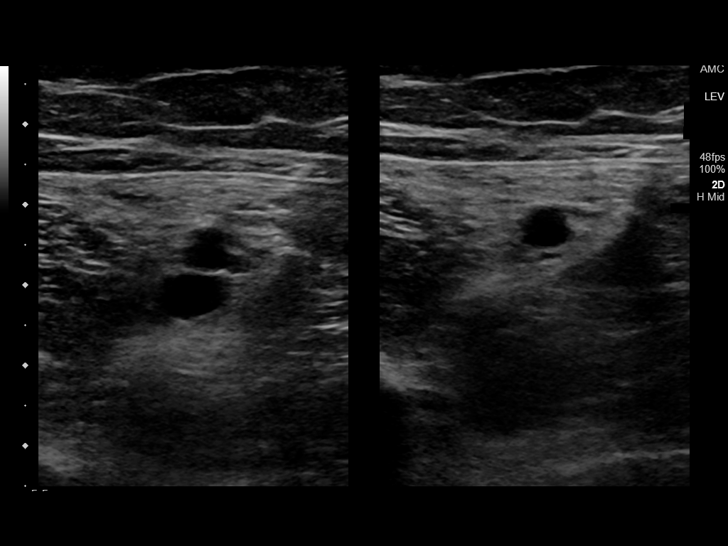
[im 14/26]
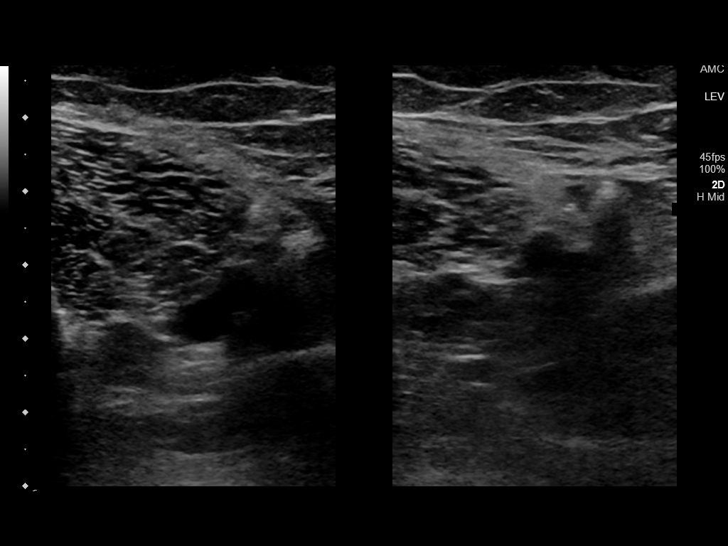
[im 15/26]
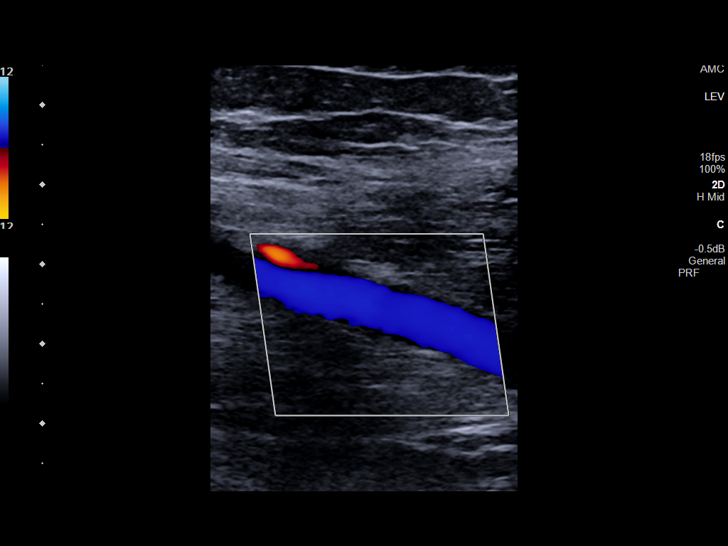
[im 17/26]
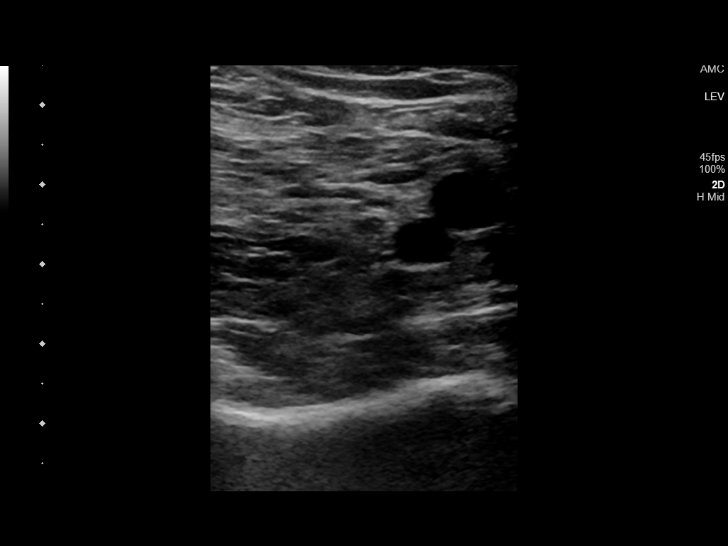
[im 19/26]
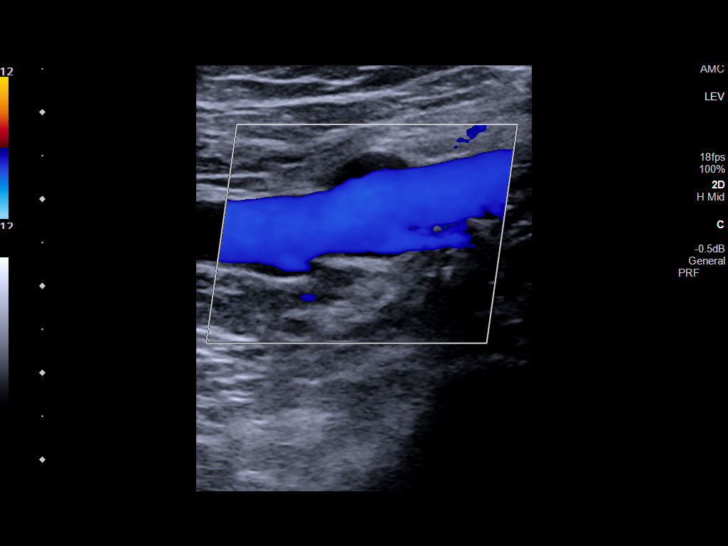
[im 21/26]
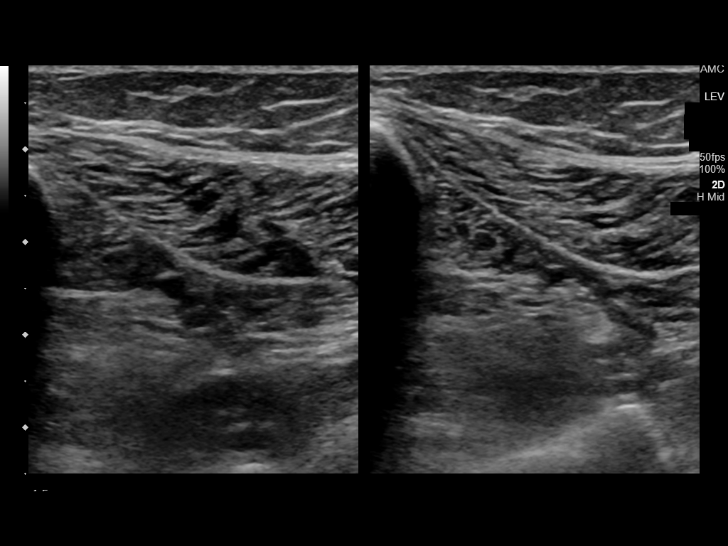
[im 23/26]
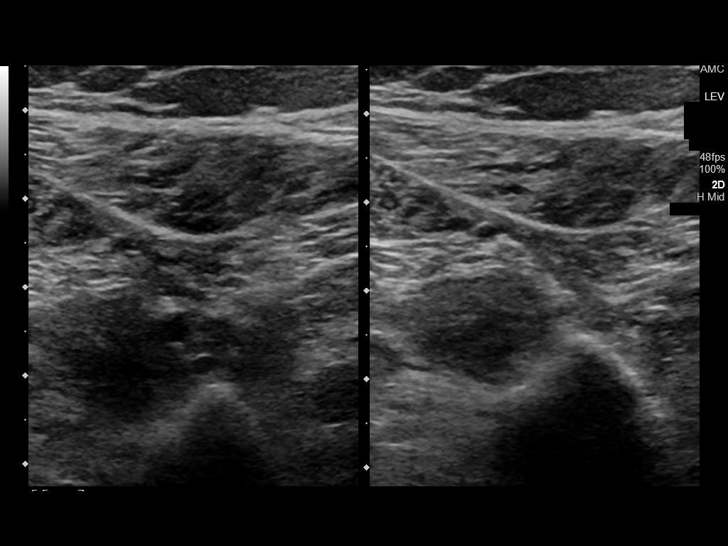
[im 26/26]
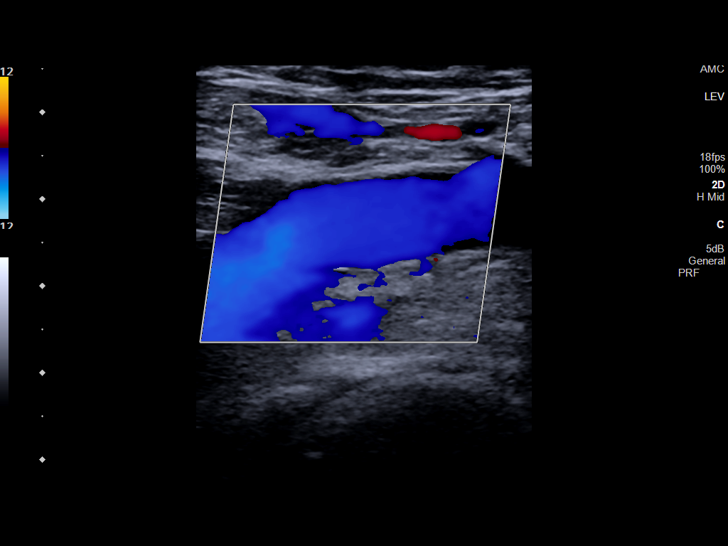

[13 of 24 positions shown; findings below may reference images not displayed]

FINDINGS: Contralateral Common Femoral Vein: No evidence of thrombus. Normal
compressibility and color Doppler flow.

Common Femoral Vein: No evidence of thrombus. Normal
compressibility, respiratory phasicity and response to augmentation.

Saphenofemoral Junction: No evidence of thrombus. Normal
compressibility and flow on color Doppler imaging.

Profunda Femoral Vein: No evidence of thrombus. Normal
compressibility and flow on color Doppler imaging.

Femoral Vein: No evidence of thrombus. Normal compressibility,
respiratory phasicity and response to augmentation.

Popliteal Vein: No evidence of thrombus. Normal compressibility,
respiratory phasicity and response to augmentation.

Calf Veins: No evidence of thrombus. Normal compressibility and flow
on color Doppler imaging.

Other Findings:  None.
IMPRESSION: Negative for deep venous thrombosis in right lower extremity.

## 2021-03-19 IMAGING — CT CT ABD-PELV W/ CM
2 of 5 series · 16 of 46 positions shown, 18 images · IV contrast (APPLIED)
Comparison: February 27, 2018

CLINICAL DATA: Abdominal pain right leg pain

EXAM:
CT ABDOMEN AND PELVIS WITH CONTRAST
TECHNIQUE: Multidetector CT imaging of the abdomen and pelvis was performed
using the standard protocol following bolus administration of
intravenous contrast.
CONTRAST:  100mL OMNIPAQUE IOHEXOL 300 MG/ML  SOLN

[Series 2: routine abd/pel with · axial · 0.95mm/px · z∈[-1198,-798]mm · 13 of 90 slices shown, 15 images]
[im 5/90  soft-tissue]
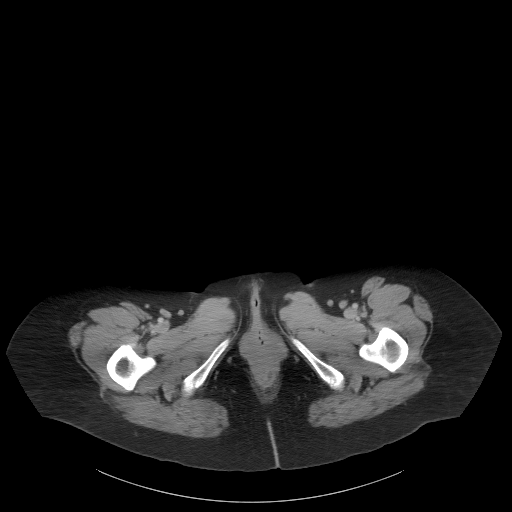
[im 5/90  bone]
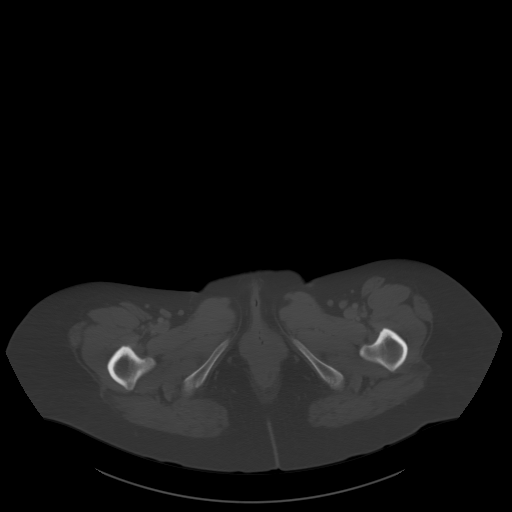
[im 15/90  soft-tissue]
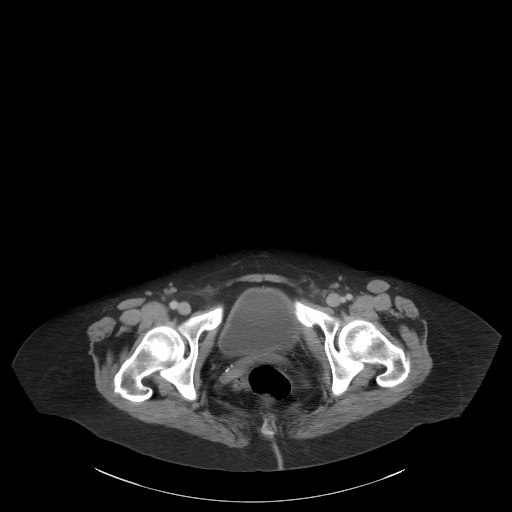
[im 19/90  soft-tissue]
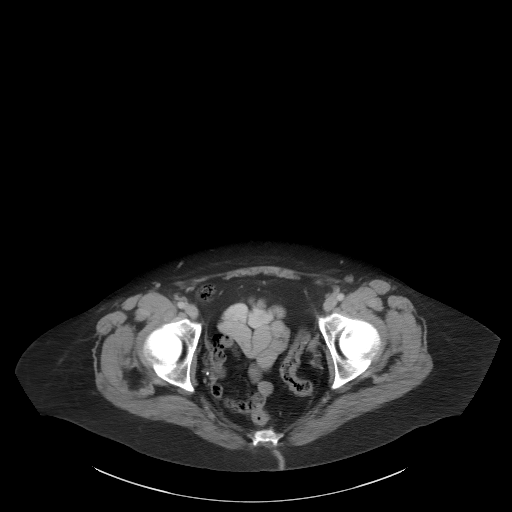
[im 24/90  soft-tissue]
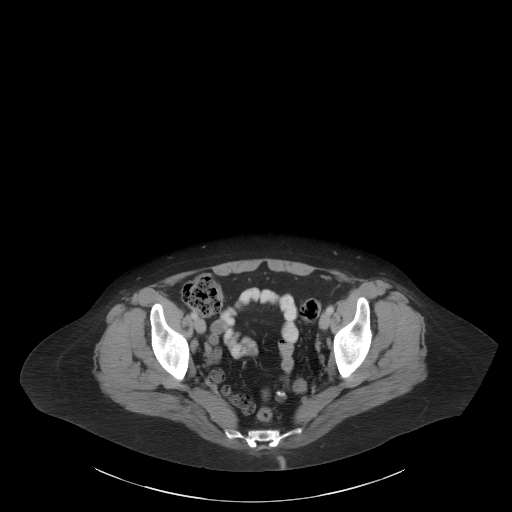
[im 33/90  soft-tissue]
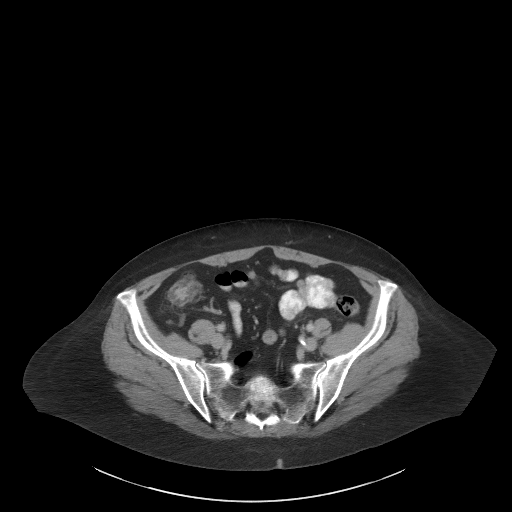
[im 38/90  soft-tissue]
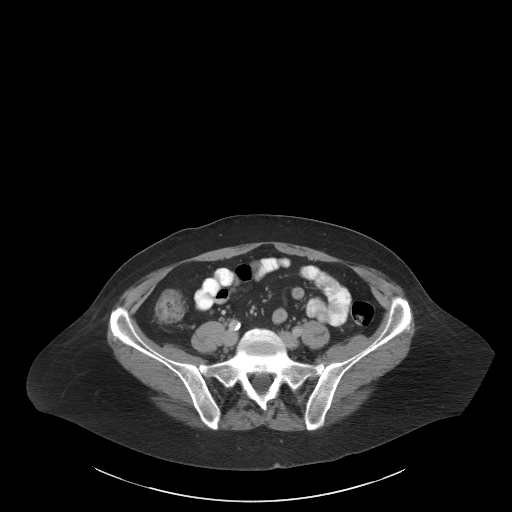
[im 47/90  soft-tissue]
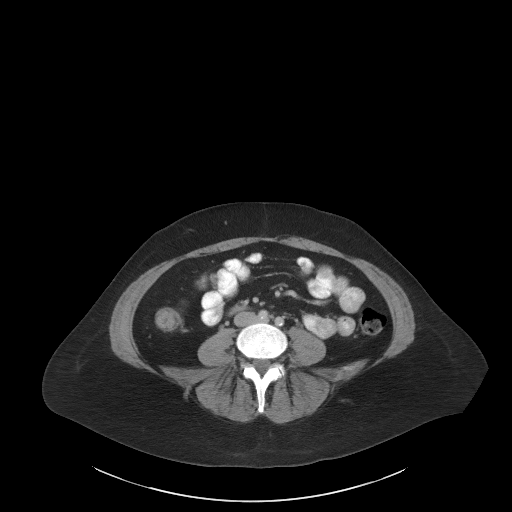
[im 52/90  soft-tissue]
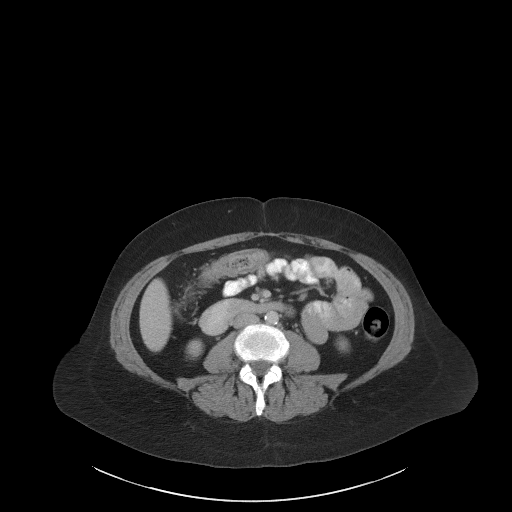
[im 57/90  soft-tissue]
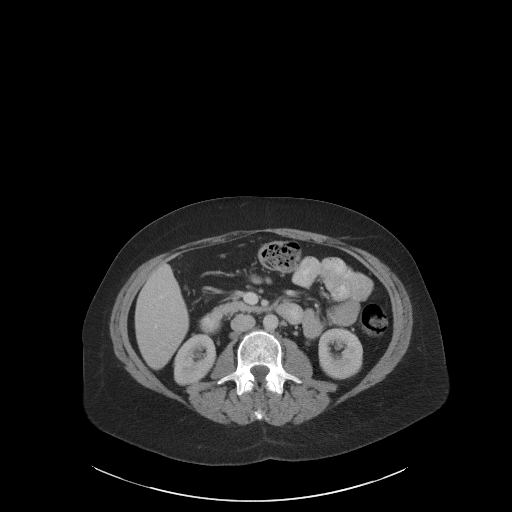
[im 57/90  bone]
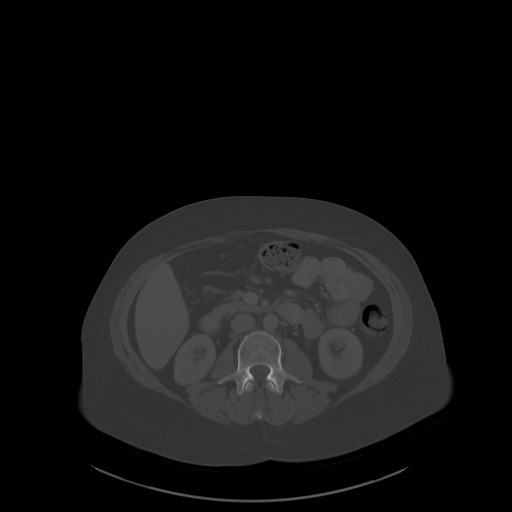
[im 66/90  soft-tissue]
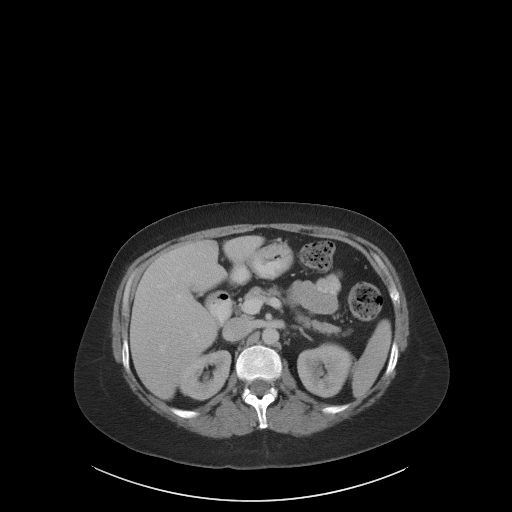
[im 71/90  soft-tissue]
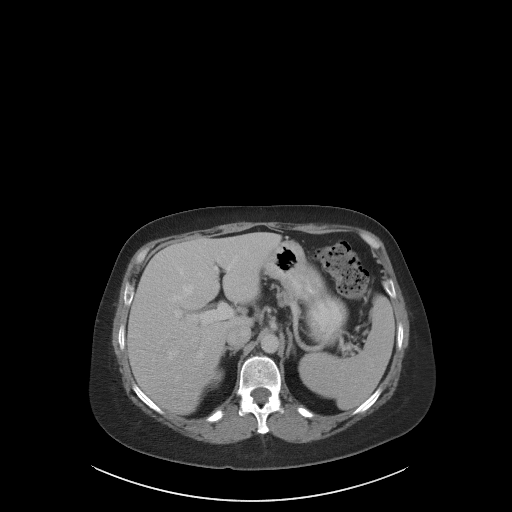
[im 75/90  soft-tissue]
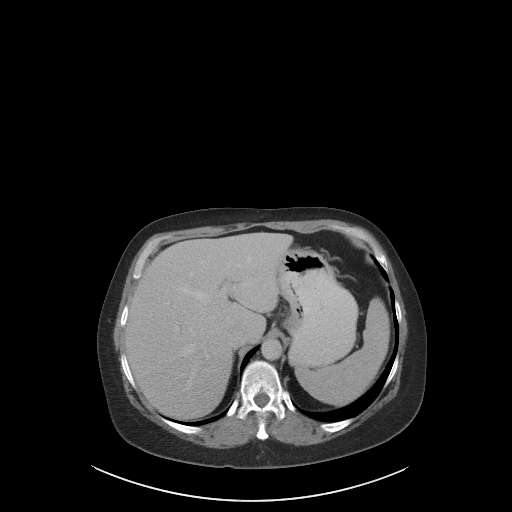
[im 85/90  soft-tissue]
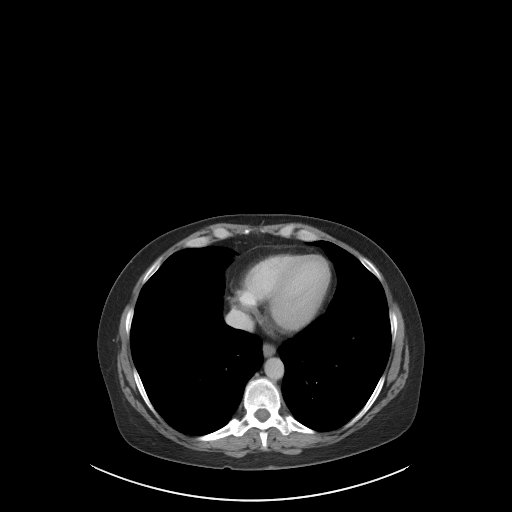

[Series 5: coronal st · coronal · 0.74mm/px · 3 of 83 slices shown]
[im 28/83  soft-tissue]
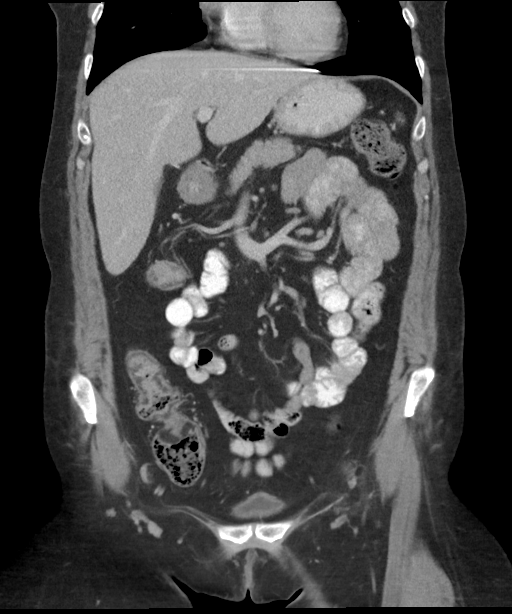
[im 37/83  soft-tissue]
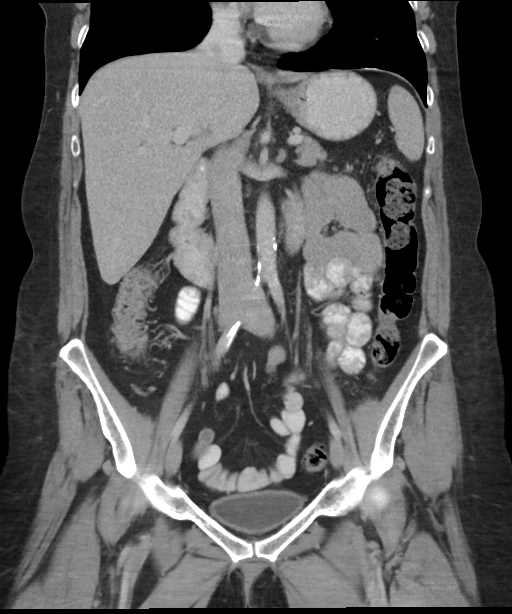
[im 46/83  soft-tissue]
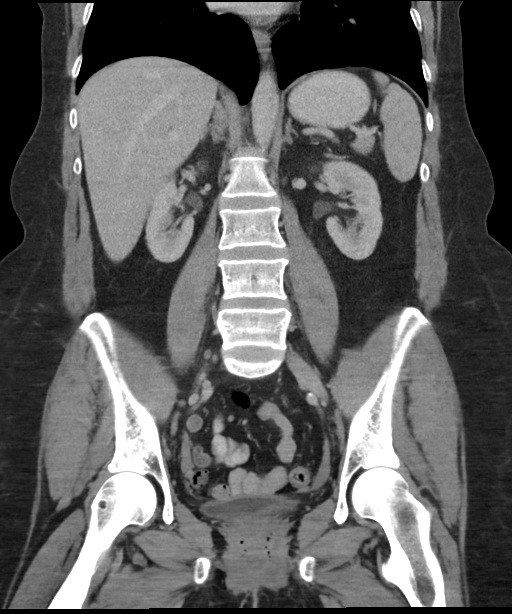

[16 of 46 positions shown; findings below may reference images not displayed]

FINDINGS: Lower chest: The visualized heart size within normal limits. No
pericardial fluid/thickening.

No hiatal hernia.

The visualized portions of the lungs are clear.

Hepatobiliary: The liver is normal in density without focal
abnormality.The main portal vein is patent. No evidence of calcified
gallstones, gallbladder wall thickening or biliary dilatation.

Pancreas: Unremarkable. No pancreatic ductal dilatation or
surrounding inflammatory changes.

Spleen: Normal in size without focal abnormality.

Adrenals/Urinary Tract: Both adrenal glands appear normal. The
kidneys and collecting system appear normal without evidence of
urinary tract calculus or hydronephrosis. Bladder is unremarkable.

Stomach/Bowel: The stomach, small bowel, and colon are normal in
appearance. No inflammatory changes, wall thickening, or obstructive
findings.The appendix is normal.

Vascular/Lymphatic: There are no enlarged mesenteric,
retroperitoneal, or pelvic lymph nodes. Scattered aortic
atherosclerotic calcifications are seen without aneurysmal
dilatation.

Reproductive: The patient is status post hysterectomy. No adnexal
masses or collections seen.

Other: No abdominal wall or inguinal hernia is noted.

Musculoskeletal: No acute or significant osseous findings.
IMPRESSION: No acute intra-abdominal or pelvic pathology to explain the
patient's symptoms.

Aortic Atherosclerosis ([id number]).

## 2023-11-11 ENCOUNTER — Other Ambulatory Visit: Payer: Self-pay

## 2023-11-11 ENCOUNTER — Emergency Department
Admission: EM | Admit: 2023-11-11 | Discharge: 2023-11-11 | Disposition: A | Discharge status: Home / Self Care | Payer: 59 | Attending: Student in an Organized Health Care Education/Training Program | Admitting: Student in an Organized Health Care Education/Training Program

## 2023-11-11 ENCOUNTER — Emergency Department: Payer: 59

## 2023-11-11 DIAGNOSIS — S66812A Strain of other specified muscles, fascia and tendons at wrist and hand level, left hand, initial encounter: Secondary | ICD-10-CM

## 2023-11-11 DIAGNOSIS — W232XXA Caught, crushed, jammed or pinched between a moving and stationary object, initial encounter: Secondary | ICD-10-CM | POA: Diagnosis not present

## 2023-11-11 DIAGNOSIS — M66242 Spontaneous rupture of extensor tendons, left hand: Secondary | ICD-10-CM | POA: Diagnosis not present

## 2023-11-11 DIAGNOSIS — S6992XA Unspecified injury of left wrist, hand and finger(s), initial encounter: Secondary | ICD-10-CM | POA: Diagnosis present

## 2023-11-11 NOTE — ED Notes (Signed)
Patient is not in the room at this time. Alvy Beal, PA-C aware.

## 2023-11-11 NOTE — Discharge Instructions (Signed)
It is very important that you follow-up with hand surgery to have your tendon repaired.  Keep your finger splinted in extension in the meantime.  Please call the foot number provided.  Please return for any new, worsening, or change in symptoms or other concerns.  It was a pleasure caring for you today.

## 2023-11-11 NOTE — ED Provider Notes (Signed)
[facility] Provider Note    Event Date/Time   First MD Initiated Contact with Patient 11/11/23 1240     (approximate)   History   No chief complaint on file.   HPI  Sheila Caldwell is a 52 y.o. female with no reported past medical history presents today for evaluation of left fifth finger injury.  Patient reports that her hand got stuck in a sliding door just prior to arrival.  She reports that she is unable to extend the tip of her left fifth finger.  No open wounds.  No numbness or tingling.  There are no problems to display for this patient.         Physical Exam   Triage Vital Signs: ED Triage Vitals [11/11/23 1220]  Encounter Vitals Group     BP      Systolic BP Percentile      Diastolic BP Percentile      Pulse      Resp      Temp      Temp src      SpO2      Weight 128 lb (58.1 kg)     Height 5\' 4"  (1.626 m)     Head Circumference      Peak Flow      Pain Score      Pain Loc      Pain Education      Exclude from Growth Chart     Most recent vital signs: There were no vitals filed for this visit.  Physical Exam Vitals and nursing note reviewed.  Constitutional:      General: Awake and alert. No acute distress.    Appearance: Normal appearance. The patient is normal weight.  HENT:     Head: Normocephalic and atraumatic.     Mouth: Mucous membranes are moist.  Eyes:     General: PERRL. Normal EOMs        Right eye: No discharge.        Left eye: No discharge.     Conjunctiva/sclera: Conjunctivae normal.  Cardiovascular:     Rate and Rhythm: Normal rate and regular rhythm.     Pulses: Normal pulses.     Heart sounds: Normal heart sounds Pulmonary:     Effort: Pulmonary effort is normal. No respiratory distress.     Breath sounds: Normal breath sounds.  Abdominal:     Abdomen is soft. There is no abdominal tenderness. No rebound or guarding. No distention. Musculoskeletal:        General: No swelling. Normal range  of motion.     Cervical back: Normal range of motion and neck supple.  Left hand: There is a finger lag at the DIP of the fifth finger.  Patient is unable to actively extend at the DIP.  She is able to flex and extend at the isolated PIP.  There is no swelling.  There are no open wounds.  Normal capillary refill.  Sensation intact light touch distally. Skin:    General: Skin is warm and dry.     Capillary Refill: Capillary refill takes less than 2 seconds.     Findings: No rash.  Neurological:     Mental Status: The patient is awake and alert.      ED Results / Procedures / Treatments   Labs (all labs ordered are listed, but only abnormal results are displayed) Labs Reviewed - No data to display   EKG  RADIOLOGY I independently reviewed and interpreted imaging and agree with radiologists findings.     PROCEDURES:  Critical Care performed:   Procedures   MEDICATIONS ORDERED IN ED: Medications - No data to display   IMPRESSION / MDM / ASSESSMENT AND PLAN / ED COURSE  I reviewed the triage vital signs and the nursing notes.   Differential diagnosis includes, but is not limited to, tendon rupture, fracture, dislocation.  Patient is awake and alert, nontoxic in appearance.  She is able to actively extend at her isolated DIP, highly suspicious for an extensor tendon rupture.  This was discussed with the patient and advised that this will require surgery.  I recommended that she be placed in a splint in extension and patient agreed to do this.  X-ray obtained in triage demonstrates no acute fracture or dislocation.  No triage vitals were taken, and I asked the nurse to do a set of vital signs while applying a splint.  However, the patient eloped from the emergency department prior to vital signs, splint, and discharge paperwork.  The paramedic [employer] called the patient and advised that she should have a splint and also gave the follow-up instructions.   Patient's  presentation is most consistent with acute complicated illness / injury requiring diagnostic workup.   FINAL CLINICAL IMPRESSION(S) / ED DIAGNOSES   Final diagnoses:  Extensor tendon rupture of hand, left, initial encounter     Rx / DC Orders   ED Discharge Orders     None        Note:  This document was prepared using Dragon voice recognition software and may include unintentional dictation errors.   Jackelyn Hoehn, PA-C 11/11/23 1347    Willy Eddy, MD 11/11/23 1536

## 2023-11-11 NOTE — ED Triage Notes (Signed)
Pt to ED for "smashing hand in glass door". Pain to left hand. Obvious deformity to left pinky.,
